# Patient Record
Sex: Female | Born: 1965 | Race: White | Hispanic: Yes | Smoking: Never smoker
Health system: Southern US, Community
[De-identification: ages and names within clinical notes are randomized; demographics above are authoritative.]

## PROBLEM LIST (undated history)

## (undated) DIAGNOSIS — E785 Hyperlipidemia, unspecified: Secondary | ICD-10-CM

## (undated) DIAGNOSIS — E119 Type 2 diabetes mellitus without complications: Secondary | ICD-10-CM

## (undated) DIAGNOSIS — I10 Essential (primary) hypertension: Secondary | ICD-10-CM

## (undated) HISTORY — DX: Hyperlipidemia, unspecified: E78.5

---

## 2010-06-29 ENCOUNTER — Inpatient Hospital Stay (HOSPITAL_COMMUNITY): Admission: AD | Admit: 2010-06-29 | Discharge: 2010-06-29 | Payer: Self-pay | Admitting: Obstetrics & Gynecology

## 2010-06-29 ENCOUNTER — Ambulatory Visit: Payer: Self-pay | Admitting: Obstetrics and Gynecology

## 2010-08-31 ENCOUNTER — Ambulatory Visit (HOSPITAL_COMMUNITY)
Admission: RE | Admit: 2010-08-31 | Discharge: 2010-08-31 | Payer: Self-pay | Source: Home / Self Care | Attending: Family Medicine | Admitting: Family Medicine

## 2010-11-30 LAB — WET PREP, GENITAL: Clue Cells Wet Prep HPF POC: NONE SEEN

## 2010-11-30 LAB — HCG, QUANTITATIVE, PREGNANCY: hCG, Beta Chain, Quant, S: 143 m[IU]/mL — ABNORMAL HIGH (ref <5)

## 2010-11-30 LAB — ABO/RH: ABO/RH(D): O POS

## 2010-11-30 LAB — CBC
HCT: 37.6 % (ref 36.0–46.0)
Hemoglobin: 13 g/dL (ref 12.0–15.0)
MCV: 89.5 fL (ref 78.0–100.0)
RBC: 4.2 MIL/uL (ref 3.87–5.11)
WBC: 5.7 10*3/uL (ref 4.0–10.5)

## 2010-11-30 LAB — POCT PREGNANCY, URINE: Preg Test, Ur: POSITIVE

## 2012-06-11 IMAGING — US US OB TRANSVAGINAL MODIFY
1 series · 14 of 28 positions shown · non-contrast
Comparison: none

OBSTETRICAL ULTRASOUND:
 This ultrasound exam was performed in the [HOSPITAL] Ultrasound Department.  The OB US report was generated in the AS system, and faxed to the ordering physician.  This report is also available in [HOSPITAL]?s AccessANYware and in [REDACTED] PACS.

[Series 1: us ob comp less 14 wks · 0.24mm/px · 14 of 59 slices shown]
[im 3/59]
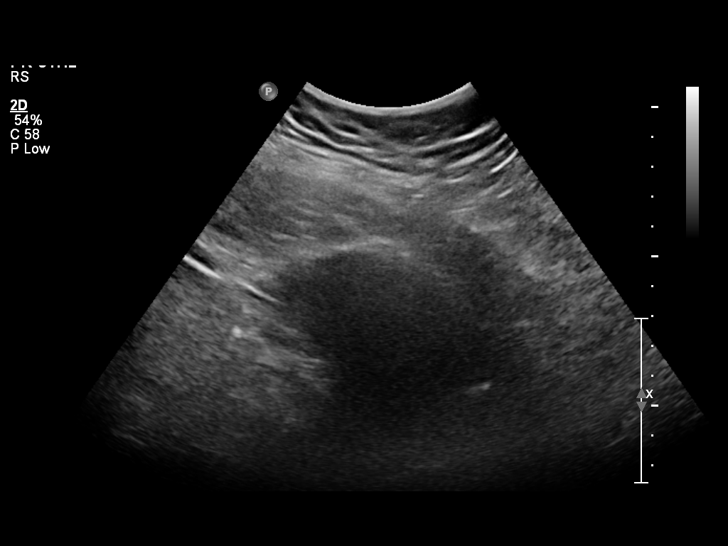
[im 7/59]
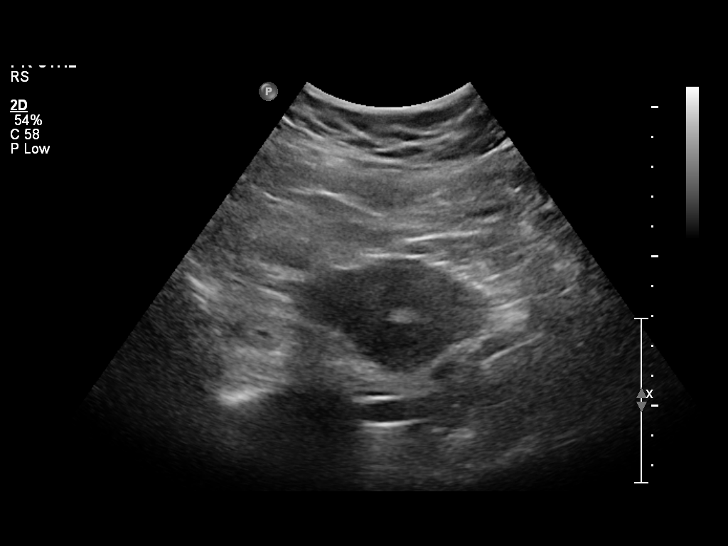
[im 11/59]
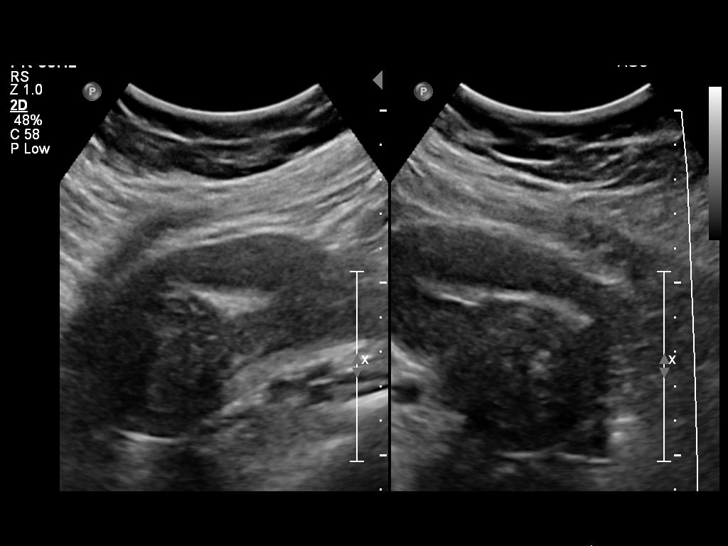
[im 16/59]
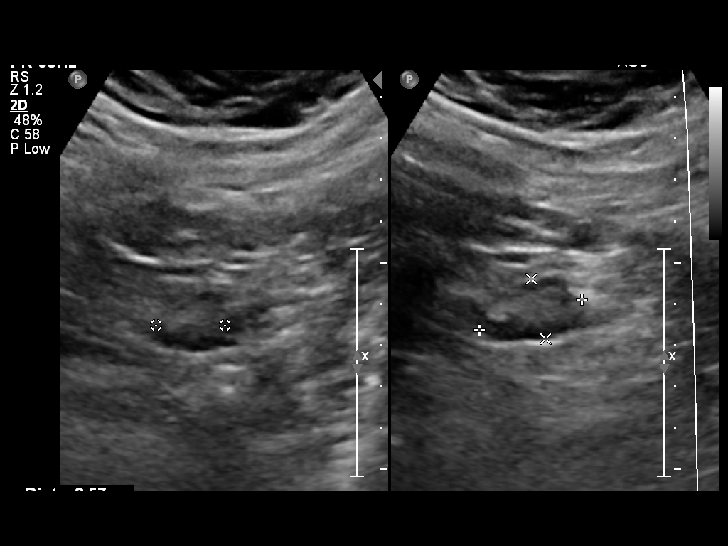
[im 20/59]
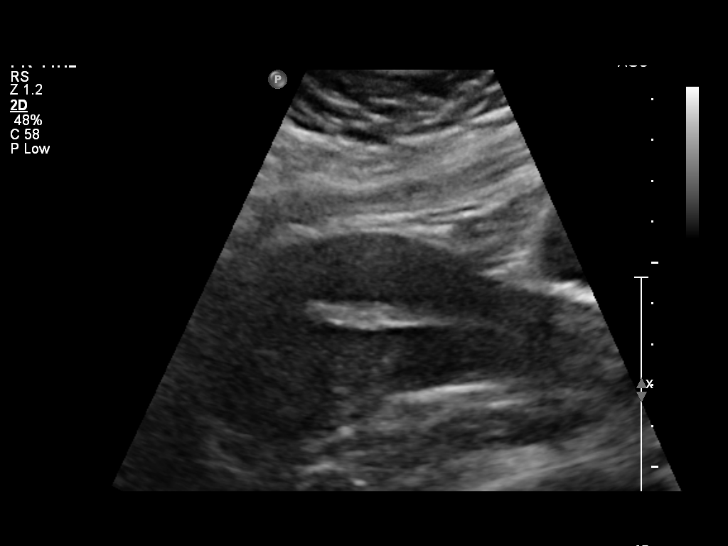
[im 24/59]
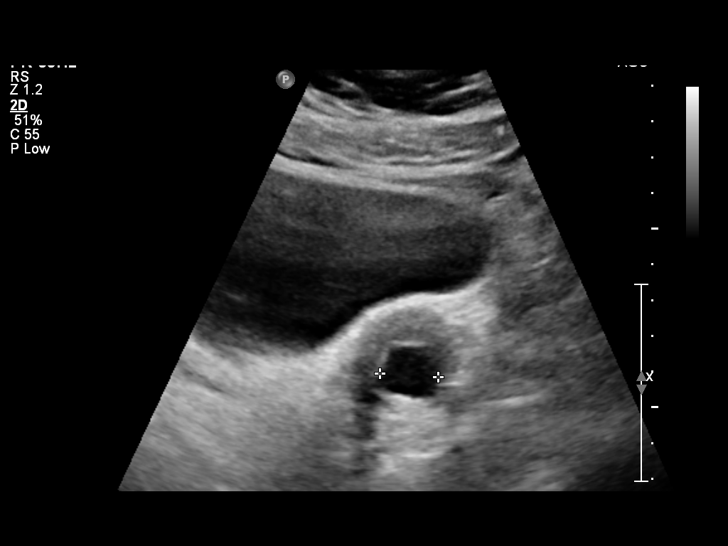
[im 28/59]
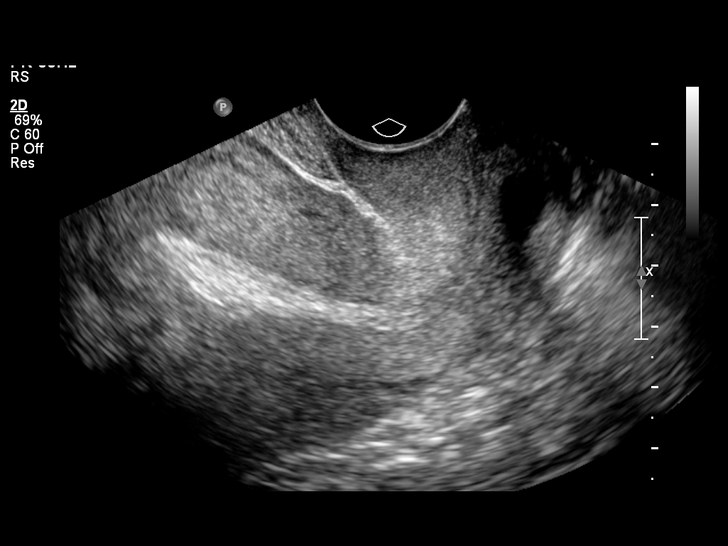
[im 33/59]
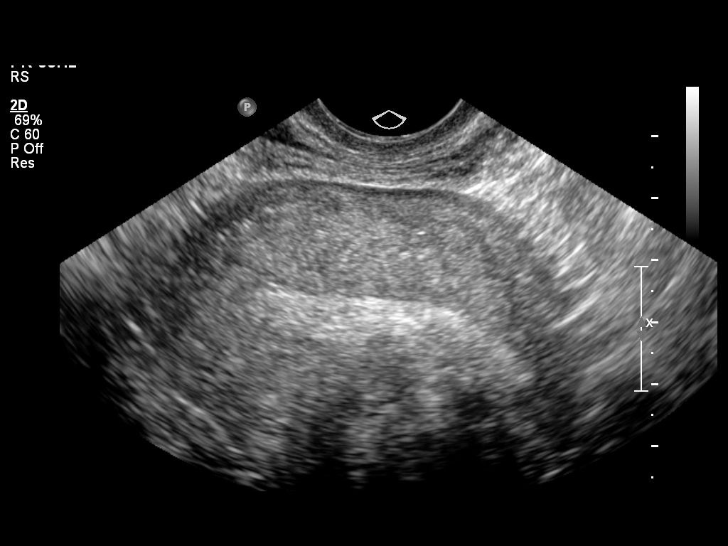
[im 37/59]
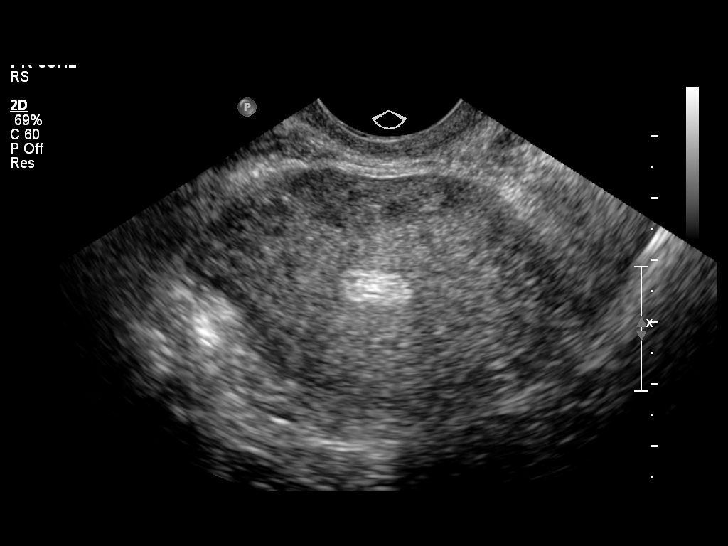
[im 41/59]
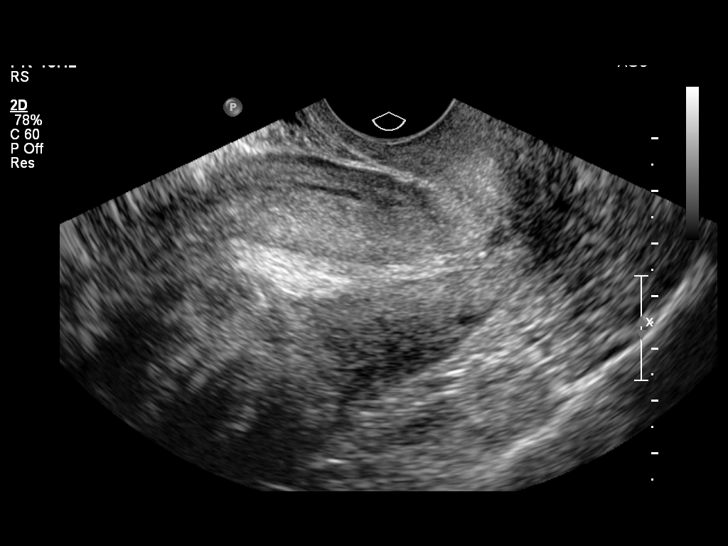
[im 46/59]
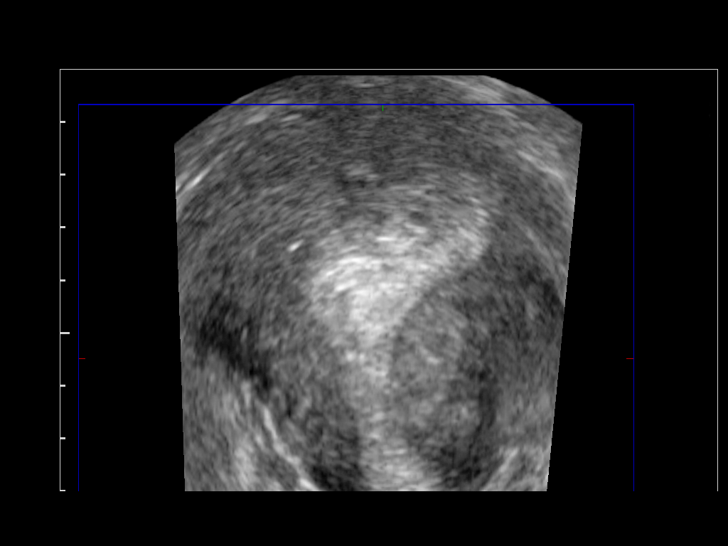
[im 50/59]
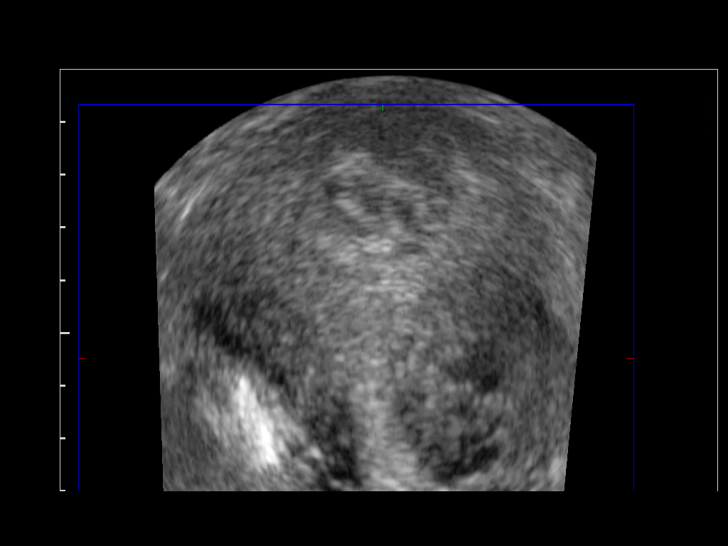
[im 54/59]
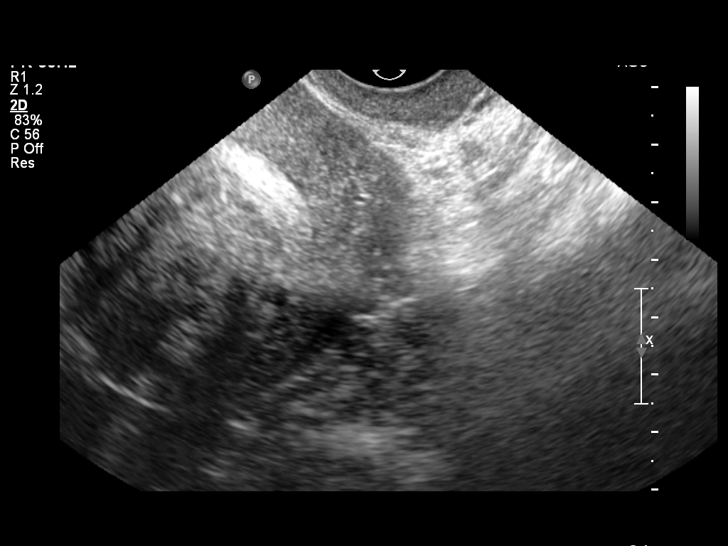
[im 59/59]
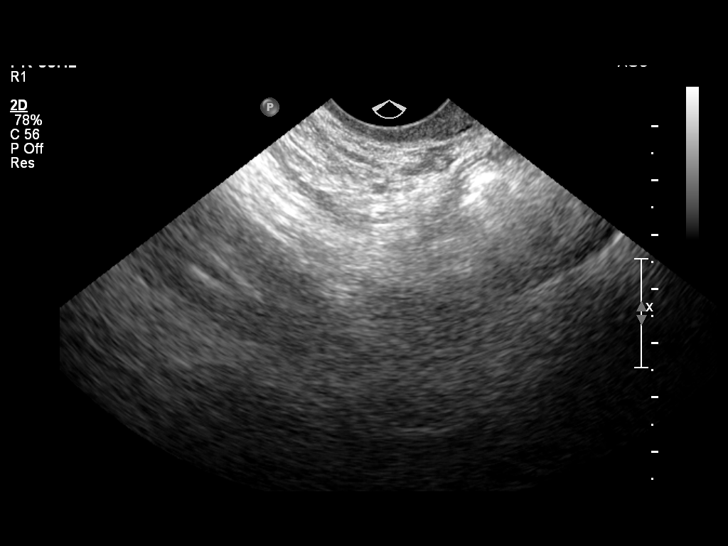

[14 of 28 positions shown; findings below may reference images not displayed]

IMPRESSION: See AS Obstetric US report.

## 2018-04-07 ENCOUNTER — Encounter: Payer: Self-pay | Admitting: Family Medicine

## 2018-04-07 ENCOUNTER — Other Ambulatory Visit: Payer: Self-pay

## 2018-04-07 ENCOUNTER — Ambulatory Visit: Payer: Self-pay | Admitting: Family Medicine

## 2018-04-07 VITALS — BP 177/85 | HR 103 | Temp 103.0°F | Ht <= 58 in | Wt 186.4 lb

## 2018-04-07 DIAGNOSIS — R109 Unspecified abdominal pain: Secondary | ICD-10-CM

## 2018-04-07 DIAGNOSIS — R509 Fever, unspecified: Secondary | ICD-10-CM

## 2018-04-07 DIAGNOSIS — R5383 Other fatigue: Secondary | ICD-10-CM

## 2018-04-07 DIAGNOSIS — R300 Dysuria: Secondary | ICD-10-CM

## 2018-04-07 LAB — POCT INFLUENZA A/B
Influenza A, POC: NEGATIVE
Influenza B, POC: NEGATIVE

## 2018-04-07 LAB — POCT CBC
Granulocyte percent: 70.9 %G (ref 37–80)
HEMATOCRIT: 38.6 % (ref 37.7–47.9)
HEMOGLOBIN: 12.5 g/dL (ref 12.2–16.2)
LYMPH, POC: 1.7 (ref 0.6–3.4)
MCH: 28 pg (ref 27–31.2)
MCHC: 32.4 g/dL (ref 31.8–35.4)
MCV: 86.2 fL (ref 80–97)
MID (cbc): 0.5 (ref 0–0.9)
MPV: 7.2 fL (ref 0–99.8)
POC Granulocyte: 5.5 (ref 2–6.9)
POC LYMPH PERCENT: 22.3 %L (ref 10–50)
POC MID %: 6.8 % (ref 0–12)
Platelet Count, POC: 179 10*3/uL (ref 142–424)
RBC: 4.48 M/uL (ref 4.04–5.48)
RDW, POC: 13.7 %
WBC: 7.8 10*3/uL (ref 4.6–10.2)

## 2018-04-07 LAB — POCT URINALYSIS DIP (MANUAL ENTRY)
BILIRUBIN UA: NEGATIVE mg/dL
Bilirubin, UA: NEGATIVE
Blood, UA: NEGATIVE
Glucose, UA: NEGATIVE mg/dL
Leukocytes, UA: NEGATIVE
Nitrite, UA: NEGATIVE
PH UA: 6 (ref 5.0–8.0)
SPEC GRAV UA: 1.025 (ref 1.010–1.025)
Urobilinogen, UA: 2 E.U./dL — AB

## 2018-04-07 LAB — POC MICROSCOPIC URINALYSIS (UMFC): Mucus: ABSENT

## 2018-04-07 LAB — GLUCOSE, POCT (MANUAL RESULT ENTRY): POC Glucose: 176 mg/dl — AB (ref 70–99)

## 2018-04-07 NOTE — Patient Instructions (Addendum)
Unfortunately I do not have a cause of your fever based on testing in the office.  With the amount of fatigue, fever, and other current symptoms, you will need further evaluation to the emergency room.  Please proceed to the emergency room as soon as leaving our office.  They will be able to see the testing that I have performed here in our office.    IF you received an x-ray today, you will receive an invoice from [facility]. Please contact [facility] at [phone] with questions or concerns regarding your invoice.   IF you received labwork today, you will receive an invoice from [facility]. Please contact [facility] at [phone] with questions or concerns regarding your invoice.   Our billing staff will not be able to assist you with questions regarding bills from these companies.  You will be contacted with the lab results as soon as they are available. The fastest way to get your results is to activate your My Chart account. Instructions are located on the last page of this paperwork. If you have not heard from us regarding the results in 2 weeks, please contact this office.

## 2018-04-07 NOTE — Progress Notes (Signed)
Subjective:    Patient ID: Allison Daniels, female    DOB: [birth year], 52 y.o.   MRN: [id number]  HPI Maite Burlison is a 52 y.o. female Presents today for: Chief Complaint  Patient presents with  . Fever  . light headed  . Fatigue  . Dehydration   Presents with above symptoms, past few days.  3 days ago started with fever, did report dysuria for approximately 1 day, none currently.  Denies cough, does have slight abdominal discomfort with nausea but no vomiting.  Has not been wanting to eat or drink much this past week, husband passed away 1 week ago.  Also is complaining of headache, slight neck discomfort without stiffness no photophobia, no phonophobia.   Has had some weakness in the recliner phone numbers to help her around.  Minimal p.o. intake.  Urine output x3 today.  Denies recent tick bites or known tick exposure.  Denies rash.    There are no active problems to display for this patient.  History reviewed. No pertinent past medical history. History reviewed. No pertinent surgical history. No Known Allergies Prior to Admission medications   Medication Sig Start Date End Date Taking? Authorizing Provider  lisinopril (PRINIVIL,ZESTRIL) 10 MG tablet Take 10 mg by mouth daily.   Yes [provider]  metFORMIN (GLUCOPHAGE) 500 MG tablet Take 500 mg by mouth 2 (two) times daily with a meal.   Yes [provider]  naproxen (NAPROSYN) 500 MG tablet Take 500 mg by mouth 2 (two) times daily with a meal.   Yes [provider]  simvastatin (ZOCOR) 20 MG tablet Take 20 mg by mouth daily.   Yes [provider]   Social History   Socioeconomic History  . Marital status: Married    Spouse name: Not on file  . Number of children: Not on file  . Years of education: Not on file  . Highest education level: Not on file  Occupational History  . Not on file  Social Needs  . Financial resource strain: Not on file  . Food insecurity:   Worry: Not on file    Inability: Not on file  . Transportation needs:    Medical: Not on file    Non-medical: Not on file  Tobacco Use  . Smoking status: Never Smoker  . Smokeless tobacco: Never Used  Substance and Sexual Activity  . Alcohol use: Never    Frequency: Never  . Drug use: Not on file  . Sexual activity: Not on file  Lifestyle  . Physical activity:    Days per week: Not on file    Minutes per session: Not on file  . Stress: Not on file  Relationships  . Social connections:    Talks on phone: Not on file    Gets together: Not on file    Attends religious service: Not on file    Active member of club or organization: Not on file    Attends meetings of clubs or organizations: Not on file    Relationship status: Not on file  . Intimate partner violence:    Fear of current or ex partner: Not on file    Emotionally abused: Not on file    Physically abused: Not on file    Forced sexual activity: Not on file  Other Topics Concern  . Not on file  Social History Narrative  . Not on file    Review of Systems As above.     Objective:  Physical Exam  Constitutional: She appears well-developed and well-nourished. She appears lethargic. No distress.  HENT:  Head: Normocephalic and atraumatic.  Right Ear: Hearing, tympanic membrane, external ear and ear canal normal.  Left Ear: Hearing, tympanic membrane, external ear and ear canal normal.  Nose: Nose normal.  Mouth/Throat: Oropharynx is clear and moist. No oropharyngeal exudate.  Moist tongue.   Eyes: Pupils are equal, round, and reactive to light. Conjunctivae and EOM are normal.  Cardiovascular: Normal rate, regular rhythm, normal heart sounds and intact distal pulses.  No murmur heard. Pulmonary/Chest: Effort normal and breath sounds normal. No respiratory distress. She has no wheezes. She has no rhonchi.  Abdominal: Soft. There is tenderness (LUQ, LLQ).  Neurological: She has normal strength. She appears  lethargic.  Skin: Skin is warm and dry. Capillary refill takes less than 2 seconds. No rash noted.  Psychiatric: She has a normal mood and affect. Her behavior is normal.  Vitals reviewed.  Vitals:   04/07/18 1401  BP: (!) 177/85  Pulse: (!) 103  Temp: (!) 103 F (39.4 C)  TempSrc: Oral  SpO2: 99%  Weight: 186 lb 6.4 oz (84.6 kg)  Height: 4\' 10"  (1.473 m)     Results for orders placed or performed in visit on 04/07/18  POCT glucose (manual entry)  Result Value Ref Range   POC Glucose 176 (A) 70 - 99 mg/dl  POCT CBC  Result Value Ref Range   WBC 7.8 4.6 - 10.2 K/uL   Lymph, poc 1.7 0.6 - 3.4   POC LYMPH PERCENT 22.3 10 - 50 %L   MID (cbc) 0.5 0 - 0.9   POC MID % 6.8 0 - 12 %M   POC Granulocyte 5.5 2 - 6.9   Granulocyte percent 70.9 37 - 80 %G   RBC 4.48 4.04 - 5.48 M/uL   Hemoglobin 12.5 12.2 - 16.2 g/dL   HCT, POC 40.938.6 81.137.7 - 47.9 %   MCV 86.2 80 - 97 fL   MCH, POC 28.0 27 - 31.2 pg   MCHC 32.4 31.8 - 35.4 g/dL   RDW, POC 91.413.7 %   Platelet Count, POC 179 142 - 424 K/uL   MPV 7.2 0 - 99.8 fL   Results for orders placed or performed in visit on 04/07/18  POCT urinalysis dipstick  Result Value Ref Range   Color, UA yellow yellow   Clarity, UA clear clear   Glucose, UA negative negative mg/dL   Bilirubin, UA negative negative   Ketones, POC UA negative negative mg/dL   Spec Grav, UA 7.8291.025 5.6211.010 - 1.025   Blood, UA negative negative   pH, UA 6.0 5.0 - 8.0   Protein Ur, POC =30 (A) negative mg/dL   Urobilinogen, UA 2.0 (A) 0.2 or 1.0 E.U./dL   Nitrite, UA Negative Negative   Leukocytes, UA Negative Negative  POCT Microscopic Urinalysis ([facility])  Result Value Ref Range   WBC,UR,HPF,POC Few (A) None WBC/hpf   RBC,UR,HPF,POC None None RBC/hpf   Bacteria None None, Too numerous to count   Mucus Absent Absent   Epithelial Cells, UR Per Microscopy Many (A) None, Too numerous to count cells/hpf  POCT glucose (manual entry)  Result Value Ref Range   POC Glucose 176  (A) 70 - 99 mg/dl  POCT CBC  Result Value Ref Range   WBC 7.8 4.6 - 10.2 K/uL   Lymph, poc 1.7 0.6 - 3.4   POC LYMPH PERCENT 22.3 10 - 50 %L   MID (  cbc) 0.5 0 - 0.9   POC MID % 6.8 0 - 12 %M   POC Granulocyte 5.5 2 - 6.9   Granulocyte percent 70.9 37 - 80 %G   RBC 4.48 4.04 - 5.48 M/uL   Hemoglobin 12.5 12.2 - 16.2 g/dL   HCT, POC 16.1 09.6 - 47.9 %   MCV 86.2 80 - 97 fL   MCH, POC 28.0 27 - 31.2 pg   MCHC 32.4 31.8 - 35.4 g/dL   RDW, POC 04.5 %   Platelet Count, POC 179 142 - 424 K/uL   MPV 7.2 0 - 99.8 fL  POCT Influenza A/B  Result Value Ref Range   Influenza A, POC Negative Negative   Influenza B, POC Negative Negative       Assessment & Plan:  Dajai Wahlert is a 52 y.o. female Dysuria - Plan: POCT urinalysis dipstick, POCT Microscopic Urinalysis ([facility])  Fever, unspecified - Plan: POCT CBC, POCT Influenza A/B  Other fatigue - Plan: POCT glucose (manual entry)  Abdominal pain, unspecified abdominal location  Fever of unknown cause, tachycardia, abdominal pain, and fatigue with decreased p.o. intake reported.  Possible SIRS.  No known tick exposure, no rash.  Additionally she does have history of diabetes, hypertension, including use of lisinopril with decreased p.o. intake, acute renal insufficiency/failure possible with reported volume depletion.  Blood pressure is elevated in office, but did not take medication today.  -Based on current symptoms and exam, recommended further evaluation through emergency room.   Present with daughter who will drive her by private vehicle.  No orders of the defined types were placed in this encounter.  Patient Instructions   Unfortunately I do not have a cause of your fever based on testing in the office.  With the amount of fatigue, fever, and other current symptoms, you will need further evaluation to the emergency room.  Please proceed to the emergency room as soon as leaving our office.  They will be able to see the testing  that I have performed here in our office.    IF you received an x-ray today, you will receive an invoice from [facility]. Please contact [facility] at [phone] with questions or concerns regarding your invoice.   IF you received labwork today, you will receive an invoice from [place]. Please contact [facility] at [phone] with questions or concerns regarding your invoice.   Our billing staff will not be able to assist you with questions regarding bills from these companies.  You will be contacted with the lab results as soon as they are available. The fastest way to get your results is to activate your My Chart account. Instructions are located on the last page of this paperwork. If you have not heard from [place] regarding the results in 2 weeks, please contact this office.       I personally performed the services described in this documentation, which was scribed in my presence. The recorded information has been reviewed and considered for accuracy and completeness, addended by me as needed, and agree with information above.  Signed,   Meredith Staggers, MD Primary Care at [facility].  04/07/18 2:59 PM

## 2018-04-08 ENCOUNTER — Ambulatory Visit: Payer: Self-pay | Admitting: Urgent Care

## 2019-04-01 ENCOUNTER — Emergency Department (HOSPITAL_COMMUNITY): Payer: Self-pay

## 2019-04-01 ENCOUNTER — Emergency Department (HOSPITAL_COMMUNITY)
Admission: EM | Admit: 2019-04-01 | Discharge: 2019-04-01 | Disposition: A | Discharge status: Home / Self Care | Payer: Self-pay | Attending: Emergency Medicine | Admitting: Emergency Medicine

## 2019-04-01 ENCOUNTER — Other Ambulatory Visit: Payer: Self-pay

## 2019-04-01 ENCOUNTER — Encounter (HOSPITAL_COMMUNITY): Payer: Self-pay

## 2019-04-01 DIAGNOSIS — Z7984 Long term (current) use of oral hypoglycemic drugs: Secondary | ICD-10-CM | POA: Insufficient documentation

## 2019-04-01 DIAGNOSIS — Z79899 Other long term (current) drug therapy: Secondary | ICD-10-CM | POA: Insufficient documentation

## 2019-04-01 DIAGNOSIS — I10 Essential (primary) hypertension: Secondary | ICD-10-CM | POA: Insufficient documentation

## 2019-04-01 DIAGNOSIS — R0602 Shortness of breath: Secondary | ICD-10-CM | POA: Insufficient documentation

## 2019-04-01 DIAGNOSIS — E119 Type 2 diabetes mellitus without complications: Secondary | ICD-10-CM | POA: Insufficient documentation

## 2019-04-01 DIAGNOSIS — R091 Pleurisy: Secondary | ICD-10-CM | POA: Insufficient documentation

## 2019-04-01 HISTORY — DX: Essential (primary) hypertension: I10

## 2019-04-01 HISTORY — DX: Type 2 diabetes mellitus without complications: E11.9

## 2019-04-01 LAB — CBC
HCT: 40.3 % (ref 36.0–46.0)
Hemoglobin: 13 g/dL (ref 12.0–15.0)
MCH: 28.9 pg (ref 26.0–34.0)
MCHC: 32.3 g/dL (ref 30.0–36.0)
MCV: 89.6 fL (ref 80.0–100.0)
Platelets: 207 10*3/uL (ref 150–400)
RBC: 4.5 MIL/uL (ref 3.87–5.11)
RDW: 13.6 % (ref 11.5–15.5)
WBC: 6.2 10*3/uL (ref 4.0–10.5)
nRBC: 0 % (ref 0.0–0.2)

## 2019-04-01 LAB — TROPONIN I (HIGH SENSITIVITY): Troponin I (High Sensitivity): 8 ng/L (ref <18)

## 2019-04-01 LAB — BASIC METABOLIC PANEL
Anion gap: 7 (ref 5–15)
BUN: 9 mg/dL (ref 6–20)
CO2: 23 mmol/L (ref 22–32)
Calcium: 8.2 mg/dL — ABNORMAL LOW (ref 8.9–10.3)
Chloride: 107 mmol/L (ref 98–111)
Creatinine, Ser: 0.47 mg/dL (ref 0.44–1.00)
GFR calc Af Amer: >60 mL/min (ref >60)
GFR calc non Af Amer: >60 mL/min (ref >60)
Glucose, Bld: 231 mg/dL — ABNORMAL HIGH (ref 70–99)
Potassium: 3.6 mmol/L (ref 3.5–5.1)
Sodium: 137 mmol/L (ref 135–145)

## 2019-04-01 LAB — I-STAT BETA HCG BLOOD, ED (MC, WL, AP ONLY): I-stat hCG, quantitative: <5 m[IU]/mL (ref <5)

## 2019-04-01 LAB — D-DIMER, QUANTITATIVE: D-Dimer, Quant: <0.27 ug/mL-FEU (ref 0.00–0.50)

## 2019-04-01 MED ORDER — DIAZEPAM 2 MG PO TABS: 2.0000 mg | ORAL_TABLET | Freq: Four times a day (QID) | ORAL | 0 refills | Status: DC | PRN

## 2019-04-01 MED ORDER — SODIUM CHLORIDE 0.9% FLUSH: 3.0000 mL | Freq: Once | INTRAVENOUS | Status: DC

## 2019-04-01 NOTE — ED Notes (Addendum)
Discharge instructions reviewed with pt using [place]- JP # [phone].    Pt verbalized understanding, had no questions.  Pt stated she was comfortable to walk to ED exit.  NAD.

## 2019-04-01 NOTE — ED Provider Notes (Signed)
[facility]-EMERGENCY DEPT Provider Note   CSN: [id number] Arrival date & time: 04/01/19  1134     History   Chief Complaint Chief Complaint  Patient presents with  . Chest Pain  . Shortness of Breath    HPI Allison Daniels is a 53 y.o. female.     53 year old female with history of diabetes and hypertension presents with intermittent sharp chest pain that is worse with inspiration.  Denies any fever or cough or congestion.  Some exertional component to her symptoms.  No prior history of same.  States that symptoms became worse after she had an argument at work.  Denies any leg pain or swelling.  No prior history of coronary disease.  Pain lasts for seconds to hours.  Does not radiate to her arms or to her back.  No treatment use prior to arrival nothing makes it better.  Interpreter used during this encounter     Past Medical History:  Diagnosis Date  . Diabetes mellitus without complication (HCC)   . Hypertension     There are no active problems to display for this patient.   History reviewed. No pertinent surgical history.   OB History   No obstetric history on file.      Home Medications    Prior to Admission medications   Medication Sig Start Date End Date Taking? Authorizing Provider  lisinopril (PRINIVIL,ZESTRIL) 10 MG tablet Take 10 mg by mouth daily.    [provider]  metFORMIN (GLUCOPHAGE) 500 MG tablet Take 500 mg by mouth 2 (two) times daily with a meal.    [provider]  naproxen (NAPROSYN) 500 MG tablet Take 500 mg by mouth 2 (two) times daily with a meal.    [provider]  simvastatin (ZOCOR) 20 MG tablet Take 20 mg by mouth daily.    [provider]    Family History No family history on file.  Social History Social History   Tobacco Use  . Smoking status: Never Smoker  . Smokeless tobacco: Never Used  Substance Use Topics  . Alcohol use: Not Currently    Frequency:  Never    Comment: ocassional  . Drug use: Not on file     Allergies   Patient has no known allergies.   Review of Systems Review of Systems  All other systems reviewed and are negative.    Physical Exam Updated Vital Signs BP (!) 194/97 (BP Location: Left Arm)   Pulse 86   Temp 99.4 F (37.4 C) (Oral)   Resp 14   Wt 83.8 kg   SpO2 98%   BMI 38.62 kg/m   Physical Exam Vitals signs and nursing note reviewed.  Constitutional:      General: She is not in acute distress.    Appearance: Normal appearance. She is well-developed. She is not toxic-appearing.  HENT:     Head: Normocephalic and atraumatic.  Eyes:     General: Lids are normal.     Conjunctiva/sclera: Conjunctivae normal.     Pupils: Pupils are equal, round, and reactive to light.  Neck:     Musculoskeletal: Normal range of motion and neck supple.     Thyroid: No thyroid mass.     Trachea: No tracheal deviation.  Cardiovascular:     Rate and Rhythm: Normal rate and regular rhythm.     Heart sounds: Normal heart sounds. No murmur. No gallop.   Pulmonary:     Effort: Pulmonary effort is normal.  No respiratory distress.     Breath sounds: Normal breath sounds. No stridor. No decreased breath sounds, wheezing, rhonchi or rales.  Abdominal:     General: Bowel sounds are normal. There is no distension.     Palpations: Abdomen is soft.     Tenderness: There is no abdominal tenderness. There is no rebound.  Musculoskeletal: Normal range of motion.        General: No tenderness.  Skin:    General: Skin is warm and dry.     Findings: No abrasion or rash.  Neurological:     Mental Status: She is alert and oriented to person, place, and time.     GCS: GCS eye subscore is 4. GCS verbal subscore is 5. GCS motor subscore is 6.     Cranial Nerves: No cranial nerve deficit.     Sensory: No sensory deficit.  Psychiatric:        Speech: Speech normal.        Behavior: Behavior normal.      ED Treatments /  Results  Labs (all labs ordered are listed, but only abnormal results are displayed) Labs Reviewed  BASIC METABOLIC PANEL  CBC  D-DIMER, QUANTITATIVE (NOT AT [facility])  I-STAT BETA HCG BLOOD, ED ([facility], [facility], [facility] ONLY)  TROPONIN I (HIGH SENSITIVITY)    EKG EKG Interpretation  Date/Time:  Wednesday April 01 2019 11:46:01 EDT Ventricular Rate:  82 PR Interval:  152 QRS Duration: 76 QT Interval:  372 QTC Calculation: 434 R Axis:   18 Text Interpretation:  Normal sinus rhythm T wave abnormality, consider lateral ischemia Abnormal ECG No old tracing to compare Confirmed by Lacretia Leigh ([id number]) on 04/01/2019 1:09:26 PM   Radiology No results found.  Procedures Procedures (including critical care time)  Medications Ordered in ED Medications - No data to display   Initial Impression / Assessment and Plan / ED Course  I have reviewed the triage vital signs and the nursing notes.  Pertinent labs & imaging results that were available during my care of the patient were reviewed by me and considered in my medical decision making (see chart for details).        Cardiac enzymes and d-dimer were negative.  X-ray is reassuring.  Suspect that she has some component of pleurisy and anxiety.  Blood pressure elevation was noted and patient encouraged to follow-up with her doctor for this.  Will place on muscle relaxants and return precautions given.  Final Clinical Impressions(s) / ED Diagnoses   Final diagnoses:  None    ED Discharge Orders    None       Lacretia Leigh, MD 04/01/19 1535

## 2019-04-01 NOTE — ED Triage Notes (Signed)
2 days ago, pt had sharp stabbing chest pain, with difficulty breathing and arm numbness. Pt was seen at urgent care, and told to come here.

## 2019-04-01 NOTE — ED Notes (Signed)
Patient transported to X-ray 

## 2019-07-04 ENCOUNTER — Other Ambulatory Visit: Payer: Self-pay

## 2019-07-04 DIAGNOSIS — Z20822 Contact with and (suspected) exposure to covid-19: Secondary | ICD-10-CM

## 2019-07-05 LAB — NOVEL CORONAVIRUS, NAA: SARS-CoV-2, NAA: DETECTED — AB

## 2019-07-08 ENCOUNTER — Telehealth: Payer: Self-pay | Admitting: Internal Medicine

## 2019-07-08 NOTE — Telephone Encounter (Signed)
Patient called for follow up positive COVID-19 test, doing well. Fever has subsided. Self isolating. All questions answered, her concerns addressed.  Angelica Chessman, MD  07/08/19  9:52 AM

## 2021-03-14 IMAGING — CR CHEST - 2 VIEW
2 series · 2 of 2 positions shown · non-contrast
Comparison: None.

CLINICAL DATA: Chest pain and shortness of breath for 2 days

EXAM:
CHEST - 2 VIEW

[w chest pa]
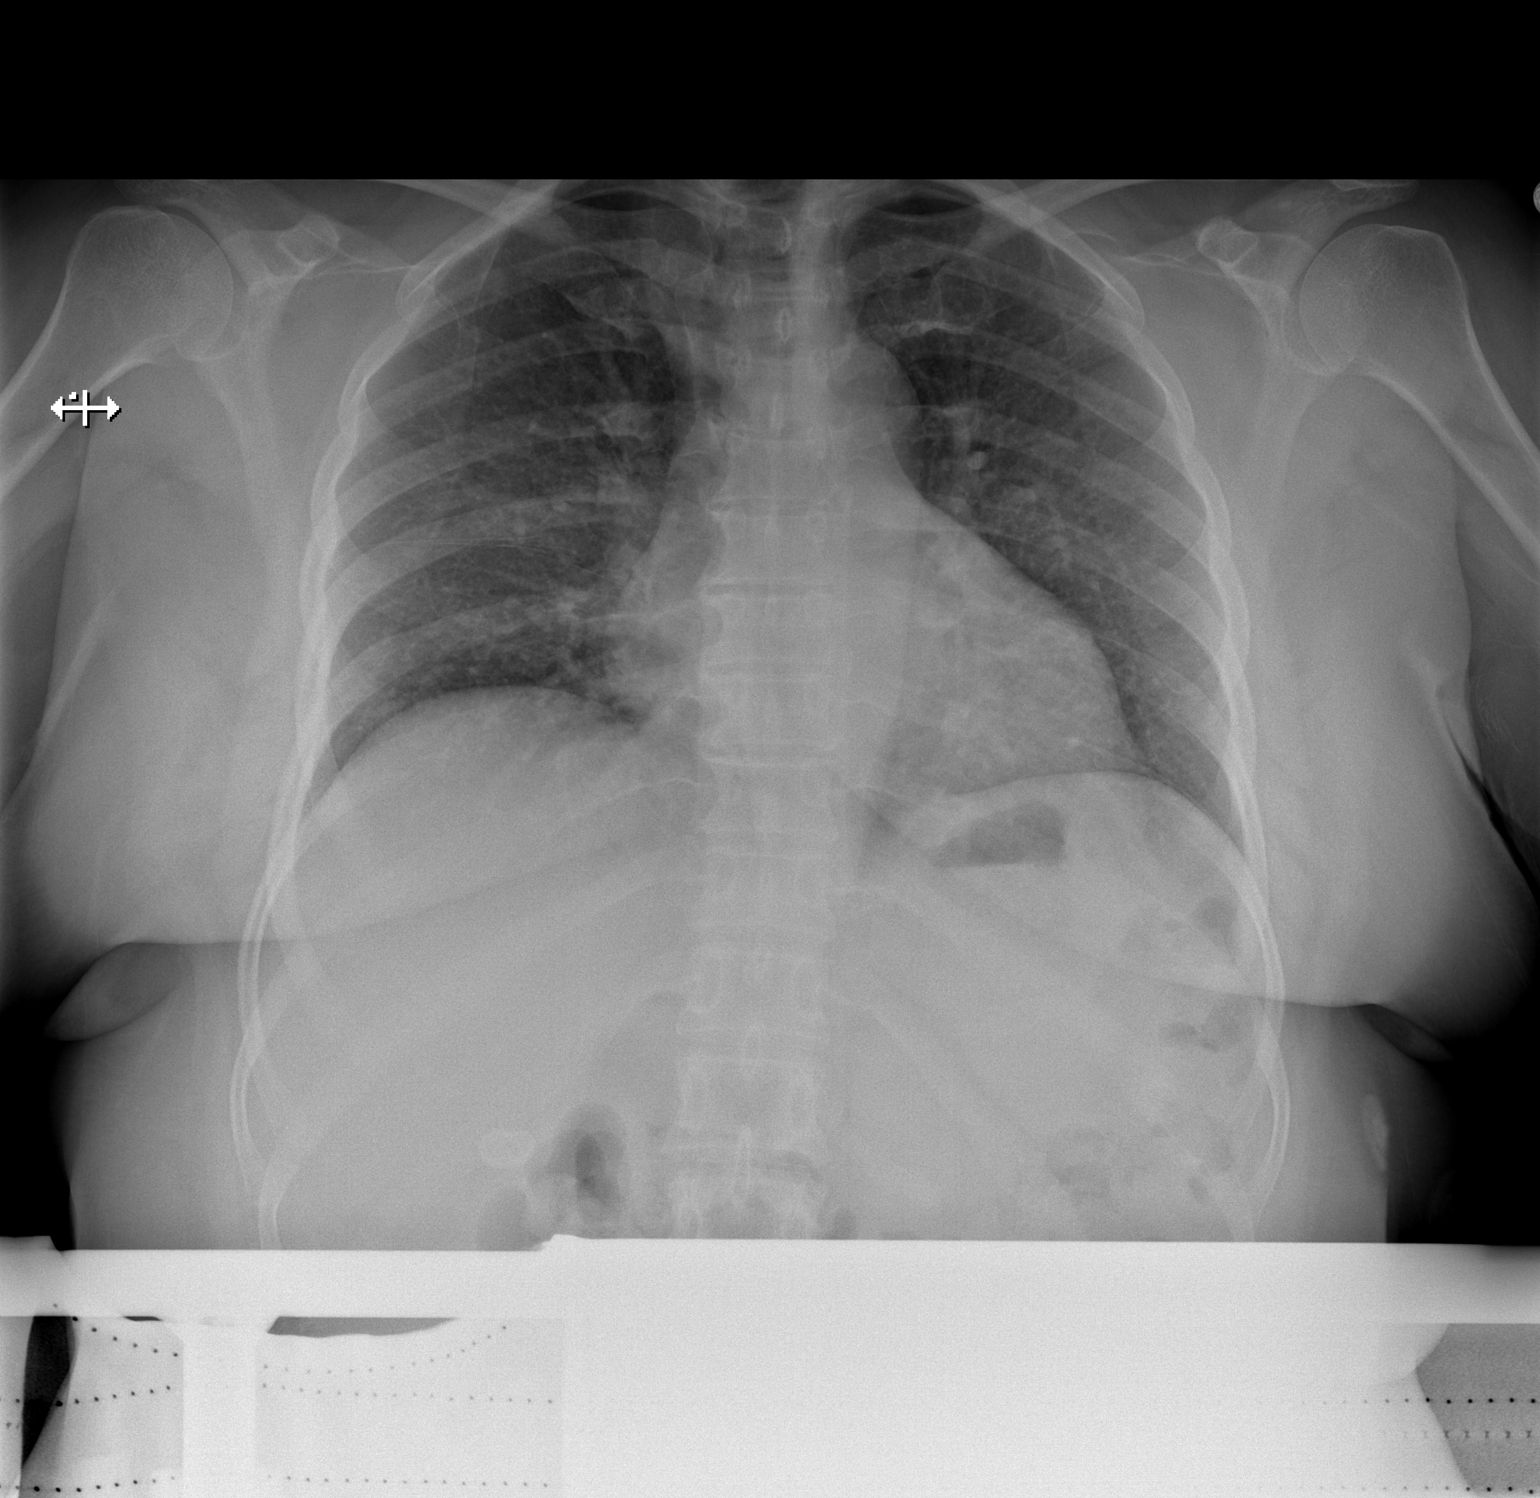

[w chest lat]
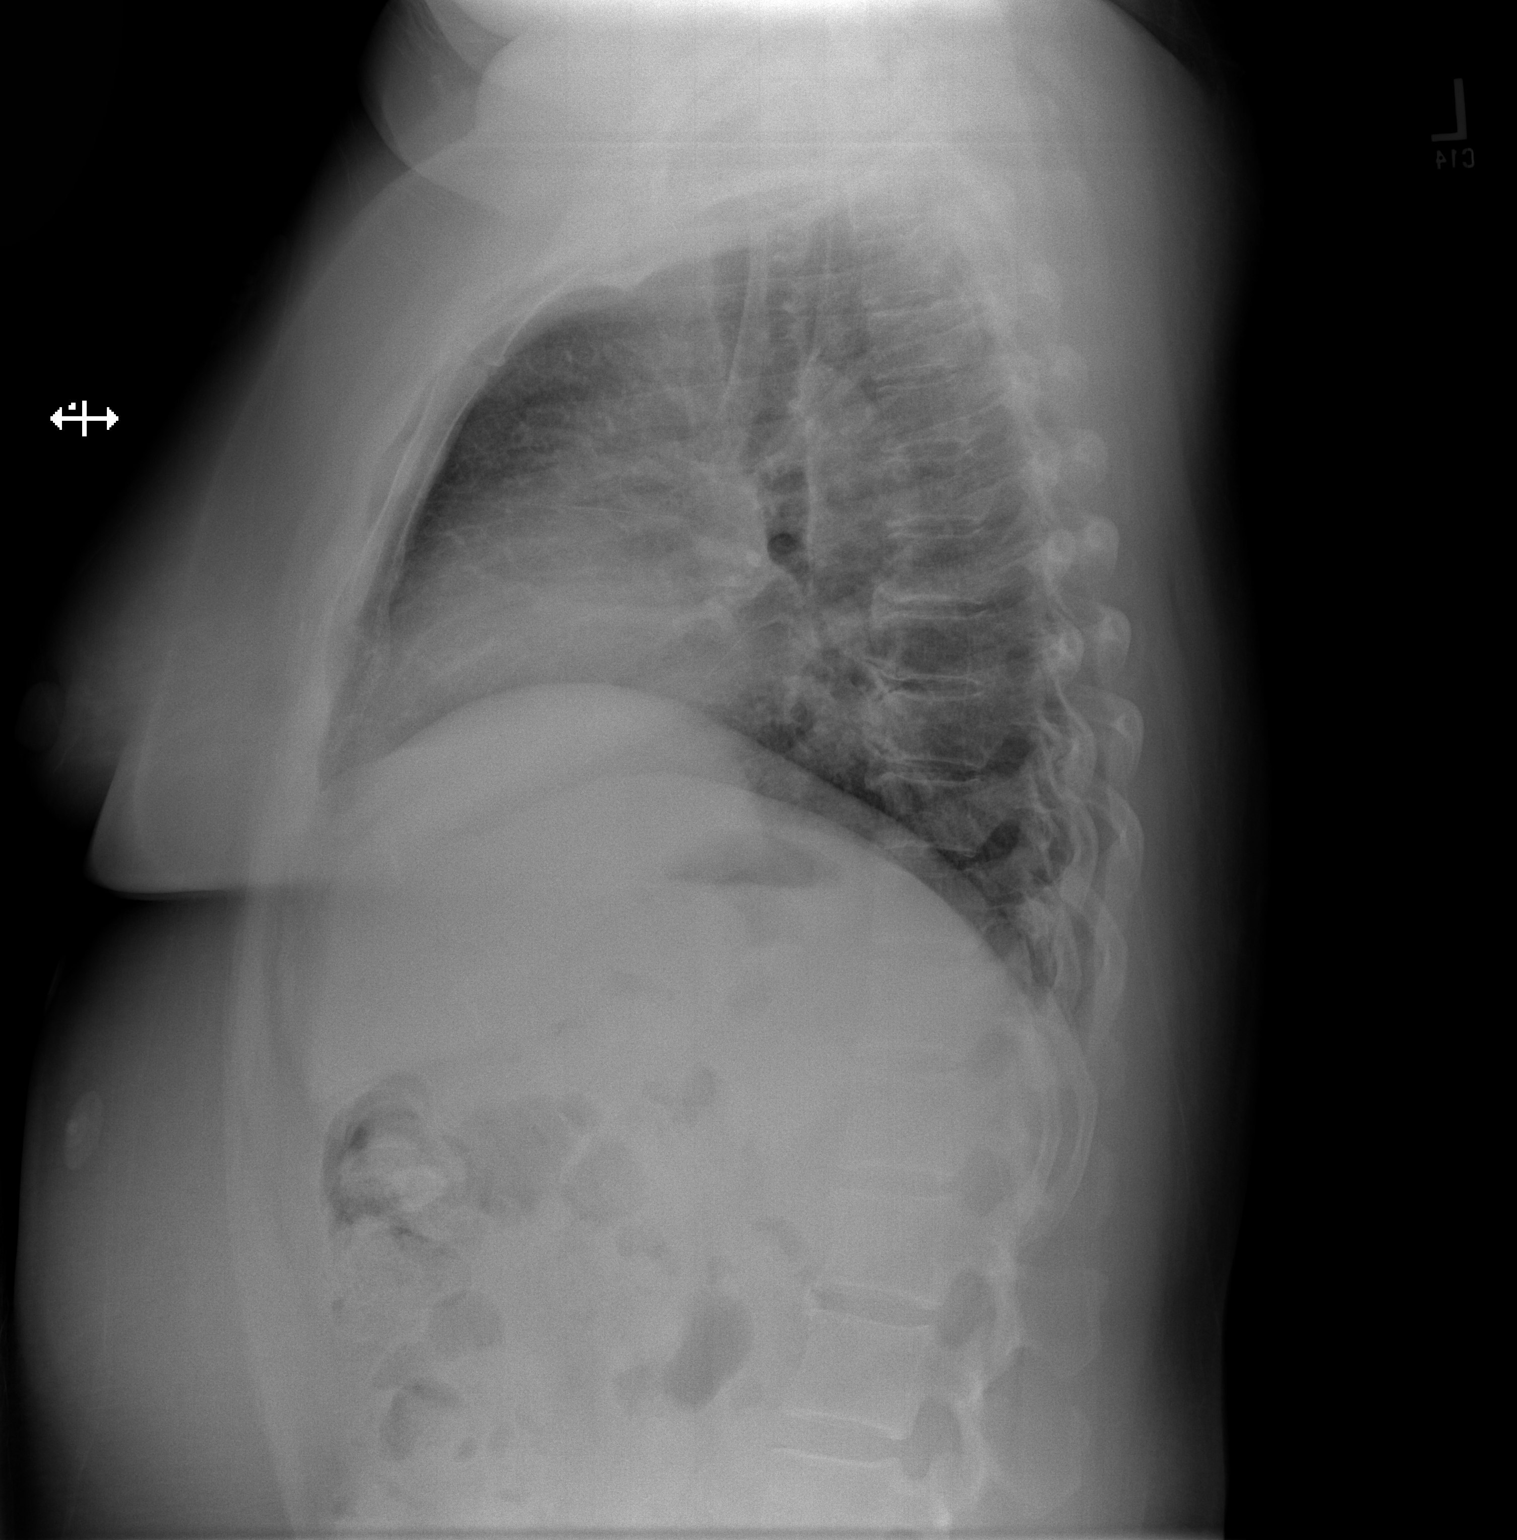

[2 of 2 positions shown; findings below may reference images not displayed]

FINDINGS: The heart size and mediastinal contours are within normal limits.
Both lungs are clear. The visualized skeletal structures are
unremarkable.
IMPRESSION: No active cardiopulmonary disease.

## 2022-12-31 ENCOUNTER — Other Ambulatory Visit: Payer: Self-pay

## 2022-12-31 DIAGNOSIS — Z1231 Encounter for screening mammogram for malignant neoplasm of breast: Secondary | ICD-10-CM

## 2023-01-31 ENCOUNTER — Ambulatory Visit: Payer: Self-pay | Admitting: Hematology and Oncology

## 2023-01-31 ENCOUNTER — Ambulatory Visit
Admission: RE | Admit: 2023-01-31 | Discharge: 2023-01-31 | Disposition: A | Discharge status: Home / Self Care | Payer: No Typology Code available for payment source | Source: Ambulatory Visit | Attending: Obstetrics and Gynecology | Admitting: Obstetrics and Gynecology

## 2023-01-31 ENCOUNTER — Other Ambulatory Visit: Payer: Self-pay

## 2023-01-31 VITALS — BP 134/57 | Wt 186.8 lb

## 2023-01-31 DIAGNOSIS — Z1231 Encounter for screening mammogram for malignant neoplasm of breast: Secondary | ICD-10-CM

## 2023-01-31 DIAGNOSIS — Z124 Encounter for screening for malignant neoplasm of cervix: Secondary | ICD-10-CM

## 2023-01-31 DIAGNOSIS — Z1211 Encounter for screening for malignant neoplasm of colon: Secondary | ICD-10-CM

## 2023-01-31 DIAGNOSIS — Z01419 Encounter for gynecological examination (general) (routine) without abnormal findings: Secondary | ICD-10-CM

## 2023-01-31 NOTE — Patient Instructions (Signed)
Taught Allison Daniels about self breast exam and gave educational materials to take home. Patient did not need a Pap smear today due to last Pap smear was in 07/20/2021 per patient. Let her know [facility] will cover Pap smears every 5 years unless has a history of abnormal Pap smears. Referred patient to the [facility] for screening mammogram. Appointment scheduled for 01/31/23. Patient aware of appointment and will be there. Let patient know will follow up with her within the next couple weeks with results. Allison Daniels verbalized understanding.  Pascal Lux, NP 2:07 PM

## 2023-01-31 NOTE — Progress Notes (Signed)
[name] is a 57 y.o. female who presents to [facility] clinic today with no complaints.    Pap Smear: Pap not smear completed today. Last Pap smear was 07/20/2021 and was normal. No HPV performed. Per patient has no history of an abnormal Pap smear. Last Pap smear result is not available in Epic.   Physical exam: Breasts Breasts symmetrical. No skin abnormalities bilateral breasts. No nipple retraction bilateral breasts. No nipple discharge bilateral breasts. No lymphadenopathy. No lumps palpated bilateral breasts.        Pelvic/Bimanual Pap is not indicated today    Smoking History: Patient has never smoked and was not referred to quit line.    Patient Navigation: Patient education provided. Access to services provided for patient through [facility] program. Natale Lay interpreter provided. No transportation provided   Colorectal Cancer Screening: Per patient has never had colonoscopy completed No complaints today. FIT test given.    Breast and Cervical Cancer Risk Assessment: Patient does not have family history of breast cancer, known genetic mutations, or radiation treatment to the chest before age 80. Patient does not have history of cervical dysplasia, immunocompromised, or DES exposure in-utero.  Risk Scores as of 01/31/2023     [name]           5-year 1.1 %   Lifetime 7.23 %   This patient is [place]/Lat[place] but has no documented birth country, so the [place] model used data from [place] patients to calculate their risk score. Document a birth country in the Demographics activity for a more accurate score.         Last calculated by Caprice Red, CMA on 01/31/2023 at  2:29 PM        A: [facility] exam without pap smear No complaints with benign exam.   P: Referred patient to the [facility] for a screening mammogram. Appointment scheduled 01/31/23.  Pascal Lux, NP 01/31/2023 2:37 PM

## 2023-02-05 ENCOUNTER — Other Ambulatory Visit: Payer: Self-pay | Admitting: Obstetrics and Gynecology

## 2023-02-05 DIAGNOSIS — R928 Other abnormal and inconclusive findings on diagnostic imaging of breast: Secondary | ICD-10-CM

## 2023-02-08 LAB — FECAL OCCULT BLOOD, IMMUNOCHEMICAL: Fecal Occult Bld: NEGATIVE

## 2023-02-20 ENCOUNTER — Ambulatory Visit
Admission: RE | Admit: 2023-02-20 | Discharge: 2023-02-20 | Disposition: A | Discharge status: Home / Self Care | Payer: No Typology Code available for payment source | Source: Ambulatory Visit | Attending: Obstetrics and Gynecology | Admitting: Obstetrics and Gynecology

## 2023-02-20 DIAGNOSIS — R928 Other abnormal and inconclusive findings on diagnostic imaging of breast: Secondary | ICD-10-CM

## 2023-02-21 ENCOUNTER — Other Ambulatory Visit: Payer: Self-pay | Admitting: Obstetrics and Gynecology

## 2023-02-21 DIAGNOSIS — R921 Mammographic calcification found on diagnostic imaging of breast: Secondary | ICD-10-CM

## 2023-02-28 ENCOUNTER — Ambulatory Visit
Admission: RE | Admit: 2023-02-28 | Discharge: 2023-02-28 | Disposition: A | Discharge status: Home / Self Care | Payer: No Typology Code available for payment source | Source: Ambulatory Visit | Attending: Obstetrics and Gynecology | Admitting: Obstetrics and Gynecology

## 2023-02-28 DIAGNOSIS — R921 Mammographic calcification found on diagnostic imaging of breast: Secondary | ICD-10-CM

## 2023-02-28 HISTORY — PX: BREAST BIOPSY: SHX20

## 2023-03-11 ENCOUNTER — Other Ambulatory Visit: Payer: Self-pay

## 2024-01-02 ENCOUNTER — Other Ambulatory Visit: Payer: Self-pay | Admitting: Obstetrics and Gynecology

## 2024-01-02 DIAGNOSIS — Z1231 Encounter for screening mammogram for malignant neoplasm of breast: Secondary | ICD-10-CM

## 2024-03-19 ENCOUNTER — Ambulatory Visit: Payer: Self-pay | Admitting: *Deleted

## 2024-03-19 ENCOUNTER — Ambulatory Visit
Admission: RE | Admit: 2024-03-19 | Discharge: 2024-03-19 | Disposition: A | Discharge status: Home / Self Care | Source: Ambulatory Visit | Attending: Obstetrics and Gynecology | Admitting: Obstetrics and Gynecology

## 2024-03-19 VITALS — BP 143/81 | Ht <= 58 in | Wt 190.0 lb

## 2024-03-19 DIAGNOSIS — Z1211 Encounter for screening for malignant neoplasm of colon: Secondary | ICD-10-CM

## 2024-03-19 DIAGNOSIS — Z1231 Encounter for screening mammogram for malignant neoplasm of breast: Secondary | ICD-10-CM

## 2024-03-19 DIAGNOSIS — Z1239 Encounter for other screening for malignant neoplasm of breast: Secondary | ICD-10-CM

## 2024-03-19 NOTE — Patient Instructions (Signed)
 Explained breast self awareness with Allison Daniels. Patient did not need a Pap smear today due to last Pap smear was 07/18/2021. Let her know [facility] will cover Pap smears every 3 years unless has a history of abnormal Pap smears. Referred patient to the [facility] of [facility] for a screening mammogram on mobile unit. Appointment scheduled Thursday, March 19, 2024 at 1330. Patient aware of appointment and will be there. Let patient know the Breast Center will follow up with her within the next couple weeks with results of mammogram by letter or phone. Allison Daniels verbalized understanding.  Tracy Gerken, Wanda Ship, RN 1:12 PM

## 2024-03-19 NOTE — Progress Notes (Signed)
 [name] is a 58 y.o. female who presents to [facility] clinic today with no complaints.    Pap Smear: Pap smear not completed today. Last Pap smear was 07/18/2021 at [facility] and was normal. Per patient has no history of an abnormal Pap smear. Last Pap smear result is available in Epic.   Physical exam: Breasts Breasts symmetrical. No skin abnormalities bilateral breasts. No nipple retraction bilateral breasts. No nipple discharge bilateral breasts. No lymphadenopathy. No lumps palpated bilateral breasts. No complaints of pain or tenderness on exam.  MM LT BREAST BX W LOC DEV 1ST LESION IMAGE BX SPEC STEREO GUIDE Addendum Date: 03/04/2023 ADDENDUM REPORT: 03/04/2023 11:13 ADDENDUM: PATHOLOGY revealed: Site 1. Breast, LEFT, needle core biopsy, upper outer, posterior extent (x clip) - FIBROCYSTIC CHANGES INCLUDING APOCRINE METAPLASIA, STROMAL FIBROSIS AND CYSTIC DILATATION WITH ASSOCIATED USUAL DUCTAL HYPERPLASIA (UDH) - SCLEROSING ADENOSIS - NEGATIVE FOR MALIGNANCY - SEE NOTE Pathology results are CONCORDANT with imaging findings, per Dr. Chyrl Phi. PATHOLOGY revealed: Site 2. Breast, LEFT, needle core biopsy, upper outer, anterior extent (coil clip) - FIBROCYSTIC CHANGES INCLUDING APOCRINE METAPLASIA, STROMAL FIBROSIS, AND CYSTIC DILATATION WITH FOCAL ASSOCIATED USUAL DUCTAL HYPERPLASIA (UDH) - RUPTURED DUCT WITH ASSOCIATED FOCAL ACUTE INFLAMMATION AND PERIDUCTULAR CHRONIC INFLAMMATION ADENOSIS - NEGATIVE FOR MALIGNANCY - SEE NOTE Pathology results are CONCORDANT with imaging findings, per Dr. Chyrl Phi. Pathology results and recommendations below were discussed with patient by telephone on 03/04/2023 using Spanish Interpre[employer] [place] [phone]. Patient reported biopsy site within normal limits with slight tenderness at the site, [employer] [phone] was used for this interaction on 03/01/2023. Post biopsy care instructions were reviewed, questions were answered and my direct  phone number was provided to patient. Patient was instructed to call [facility] if any concerns or questions arise related to the biopsy. RECOMMENDATION: The patient was instructed to return for annual May 2025 mammography. Pathology results reported by Arabella Borer, RN on 03/04/2023. Electronically Signed   By: Reyes Phi M.D.   On: 03/04/2023 11:13   Result Date: 03/04/2023 CLINICAL DATA:  58 year old female presents for tissue sampling of the anterior and posterior aspects of the 4 cm group of UPPER-OUTER LEFT breast calcifications. EXAM: LEFT BREAST STEREOTACTIC CORE NEEDLE BIOPSY X 2 COMPARISON:  Previous exam(s). FINDINGS: The patient and I discussed the procedure of stereotactic-guided biopsy including benefits and alternatives. We discussed the high likelihood of a successful procedure. We discussed the risks of the procedure including infection, bleeding, tissue injury, clip migration, and inadequate sampling. Informed written consent was given. The usual time out protocol was performed immediately prior to the procedure. LEFT BREAST STEREOTACTIC CORE NEEDLE BIOPSY (posterior aspect of UPPER-OUTER LEFT breast calcifications-X clip): Using sterile technique and 1% Lidocaine with and without epinephrine as local anesthetic, under stereotactic guidance, a 9 gauge vacuum assisted device was used to perform core needle biopsy of the posterior aspect of the 4 cm group of UPPER-OUTER LEFT breast calcifications using a SUPERIOR approach. Specimen radiograph was performed showing calcifications. Specimens with calcifications are identified for pathology. Lesion quadrant: UPPER-OUTER LEFT breast At the conclusion of the procedure, an X shaped tissue marker clip was deployed into the biopsy cavity. Follow-up 2-view mammogram was performed and dictated separately. LEFT BREAST STEREOTACTIC CORE NEEDLE BIOPSY (anterior aspect of UPPER-OUTER LEFT breast calcifications-COIL clip): Using sterile  technique and 1% Lidocaine with and without epinephrine as local anesthetic, under stereotactic guidance, a 9 gauge vacuum assisted device was used to perform core needle biopsy  of the anterior aspect of the 4 cm group of UPPER-OUTER LEFT breast calcifications using a SUPERIOR approach. Specimen radiograph was performed showing calcifications. Specimens with calcifications are identified for pathology. Lesion quadrant: UPPER-OUTER LEFT breast At the conclusion of the procedure, a COIL shaped tissue marker clip was deployed into the biopsy cavity. Follow-up 2-view mammogram was performed and dictated separately. IMPRESSION: Stereotactic-guided biopsy of posterior aspect of 4 cm group of UPPER-OUTER LEFT breast calcifications (X clip). Stereotactic guided biopsy of anterior aspect of 4 cm group of UPPER-OUTER LEFT breast calcifications (COIL clip). No apparent complications. Electronically Signed: By: Reyes Phi M.D. On: 02/28/2023 11:22  MM LT BREAST BX W LOC DEV EA AD LESION IMG BX SPEC STEREO GUIDE Addendum Date: 03/04/2023 ADDENDUM REPORT: 03/04/2023 11:13 ADDENDUM: PATHOLOGY revealed: Site 1. Breast, LEFT, needle core biopsy, upper outer, posterior extent (x clip) - FIBROCYSTIC CHANGES INCLUDING APOCRINE METAPLASIA, STROMAL FIBROSIS AND CYSTIC DILATATION WITH ASSOCIATED USUAL DUCTAL HYPERPLASIA (UDH) - SCLEROSING ADENOSIS - NEGATIVE FOR MALIGNANCY - SEE NOTE Pathology results are CONCORDANT with imaging findings, per Dr. Chyrl Phi. PATHOLOGY revealed: Site 2. Breast, LEFT, needle core biopsy, upper outer, anterior extent (coil clip) - FIBROCYSTIC CHANGES INCLUDING APOCRINE METAPLASIA, STROMAL FIBROSIS, AND CYSTIC DILATATION WITH FOCAL ASSOCIATED USUAL DUCTAL HYPERPLASIA (UDH) - RUPTURED DUCT WITH ASSOCIATED FOCAL ACUTE INFLAMMATION AND PERIDUCTULAR CHRONIC INFLAMMATION ADENOSIS - NEGATIVE FOR MALIGNANCY - SEE NOTE Pathology results are CONCORDANT with imaging findings, per Dr. Chyrl Phi. Pathology results and  recommendations below were discussed with patient by telephone on 03/04/2023 using Spanish [employer]pre[employer] [place] [phone]. Patient reported biopsy site within normal limits with slight tenderness at the site, [employer] [phone] was used for this interaction on 03/01/2023. Post biopsy care instructions were reviewed, questions were answered and my direct phone number was provided to patient. Patient was instructed to call [facility] if any concerns or questions arise related to the biopsy. RECOMMENDATION: The patient was instructed to return for annual May 2025 mammography. Pathology results reported by Arabella Borer, RN on 03/04/2023. Electronically Signed   By: Reyes Phi M.D.   On: 03/04/2023 11:13   Result Date: 03/04/2023 CLINICAL DATA:  59 year old female presents for tissue sampling of the anterior and posterior aspects of the 4 cm group of UPPER-OUTER LEFT breast calcifications. EXAM: LEFT BREAST STEREOTACTIC CORE NEEDLE BIOPSY X 2 COMPARISON:  Previous exam(s). FINDINGS: The patient and I discussed the procedure of stereotactic-guided biopsy including benefits and alternatives. We discussed the high likelihood of a successful procedure. We discussed the risks of the procedure including infection, bleeding, tissue injury, clip migration, and inadequate sampling. Informed written consent was given. The usual time out protocol was performed immediately prior to the procedure. LEFT BREAST STEREOTACTIC CORE NEEDLE BIOPSY (posterior aspect of UPPER-OUTER LEFT breast calcifications-X clip): Using sterile technique and 1% Lidocaine with and without epinephrine as local anesthetic, under stereotactic guidance, a 9 gauge vacuum assisted device was used to perform core needle biopsy of the posterior aspect of the 4 cm group of UPPER-OUTER LEFT breast calcifications using a SUPERIOR approach. Specimen radiograph was performed showing calcifications. Specimens with calcifications are  identified for pathology. Lesion quadrant: UPPER-OUTER LEFT breast At the conclusion of the procedure, an X shaped tissue marker clip was deployed into the biopsy cavity. Follow-up 2-view mammogram was performed and dictated separately. LEFT BREAST STEREOTACTIC CORE NEEDLE BIOPSY (anterior aspect of UPPER-OUTER LEFT breast calcifications-COIL clip): Using sterile technique and 1% Lidocaine with and without epinephrine as local anesthetic,  under stereotactic guidance, a 9 gauge vacuum assisted device was used to perform core needle biopsy of the anterior aspect of the 4 cm group of UPPER-OUTER LEFT breast calcifications using a SUPERIOR approach. Specimen radiograph was performed showing calcifications. Specimens with calcifications are identified for pathology. Lesion quadrant: UPPER-OUTER LEFT breast At the conclusion of the procedure, a COIL shaped tissue marker clip was deployed into the biopsy cavity. Follow-up 2-view mammogram was performed and dictated separately. IMPRESSION: Stereotactic-guided biopsy of posterior aspect of 4 cm group of UPPER-OUTER LEFT breast calcifications (X clip). Stereotactic guided biopsy of anterior aspect of 4 cm group of UPPER-OUTER LEFT breast calcifications (COIL clip). No apparent complications. Electronically Signed: By: Reyes Phi M.D. On: 02/28/2023 11:22  MM CLIP PLACEMENT LEFT Result Date: 02/28/2023 CLINICAL DATA:  Evaluate placement of biopsy clips following stereotactic guided LEFT breast biopsies. EXAM: 3D DIAGNOSTIC LEFT MAMMOGRAM POST STEREOTACTIC BIOPSY COMPARISON:  Previous exam(s). FINDINGS: 3D Mammographic images were obtained following LEFT guided biopsy of the posterior aspect of UPPER-OUTER LEFT breast calcifications (X clip) in the anterior aspect of UPPER-OUTER LEFT breast calcifications (COIL clip). The X biopsy marking clip is in expected position at the site of biopsy. The COIL biopsy marking clip is in expected position at the site of biopsy. The 2  clips are separated by a distance of 3.5 cm. IMPRESSION: Appropriate positioning of the X shaped biopsy marking clip at the site of biopsy in the UPPER OUTER LEFT breast. Appropriate positioning of the COIL shaped biopsy marking clip at the site of biopsy in the UPPER OUTER LEFT breast. Final Assessment: Post Procedure Mammograms for Marker Placement Electronically Signed   By: Reyes Phi M.D.   On: 02/28/2023 11:28  MS DIGITAL DIAG UNI LEFT Result Date: 02/20/2023 CLINICAL DATA:  58 year old female presents for further evaluation of LEFT breast calcifications identified on screening mammogram. EXAM: DIGITAL DIAGNOSTIC UNILATERAL LEFT MAMMOGRAM WITH CAD TECHNIQUE: Left digital diagnostic mammography was performed. COMPARISON:  Previous exam(s). ACR Breast Density Category b: There are scattered areas of fibroglandular density. FINDINGS: Loosely grouped primarily round calcifications within the anterior UPPER-OUTER LEFT breast are noted spanning a distance of 4 cm. No layering is noted. IMPRESSION: Indeterminate 4 cm group of UPPER-OUTER LEFT breast calcifications. Tissue sampling of the anterior and posterior aspects of these calcifications recommended. RECOMMENDATION: Two site stereotactic guided LEFT breast biopsy, which will be scheduled. I have discussed the findings and recommendations with the patient through an interpreter. If applicable, a reminder letter will be sent to the patient regarding the next appointment. BI-RADS CATEGORY  4: Suspicious. Electronically Signed   By: Reyes Phi M.D.   On: 02/20/2023 16:34  MS 3D SCR MAMMO BILAT BR (aka MM) Result Date: 02/04/2023 CLINICAL DATA:  Screening. EXAM: DIGITAL SCREENING BILATERAL MAMMOGRAM WITH TOMOSYNTHESIS AND CAD TECHNIQUE: Bilateral screening digital craniocaudal and mediolateral oblique mammograms were obtained. Bilateral screening digital breast tomosynthesis was performed. The images were evaluated with computer-aided detection. COMPARISON:   Previous exam(s). ACR Breast Density Category b: There are scattered areas of fibroglandular density. FINDINGS: In the left breast, calcifications warrant further evaluation. In the right breast, no findings suspicious for malignancy. IMPRESSION: Further evaluation is suggested for calcifications in the left breast. RECOMMENDATION: Diagnostic mammogram of the left breast. (Code:[id number]) The patient will be contacted regarding the findings, and additional imaging will be scheduled. BI-RADS CATEGORY  0: Incomplete: Need additional imaging evaluation. Electronically Signed   By: Dina  Arceo M.D.   On: 02/04/2023 10:27  Pelvic/Bimanual Pap is not indicated today per BCCCP guidelines.   Smoking History: Patient has never smoked.   Patient Navigation: Patient education provided. Access to services provided for patient through BCCCP program.   Colorectal Cancer Screening: Per patient has never had colonoscopy completed. Patient completed a FIT Test 02/03/2023 that was negative. FIT Test given to patient to complete today. No complaints today.    Breast and Cervical Cancer Risk Assessment: Patient does not have family history of breast cancer, known genetic mutations, or radiation treatment to the chest before age 89. Patient does not have history of cervical dysplasia, immunocompromised, or DES exposure in-utero.  Risk Scores as of Encounter on 03/19/2024     Alisa           5-year 1.32%   Lifetime 8.35%            Last calculated by Silas, Ansyi K, CMA on 03/19/2024 at  1:04 PM        A: BCCCP exam without pap smear No complaints.  P: Referred patient to the [facility] for a screening mammogram on mobile unit. Appointment scheduled Thursday, March 19, 2024 at 1330.  Driscilla Wanda SQUIBB, RN 03/19/2024 1:12 PM

## 2024-03-26 ENCOUNTER — Other Ambulatory Visit: Payer: Self-pay | Admitting: Obstetrics and Gynecology

## 2024-03-26 DIAGNOSIS — R928 Other abnormal and inconclusive findings on diagnostic imaging of breast: Secondary | ICD-10-CM

## 2024-03-30 ENCOUNTER — Ambulatory Visit
Admission: RE | Admit: 2024-03-30 | Discharge: 2024-03-30 | Disposition: A | Discharge status: Home / Self Care | Source: Ambulatory Visit | Attending: Obstetrics and Gynecology | Admitting: Obstetrics and Gynecology

## 2024-03-30 DIAGNOSIS — R928 Other abnormal and inconclusive findings on diagnostic imaging of breast: Secondary | ICD-10-CM

## 2024-05-19 ENCOUNTER — Telehealth: Payer: Self-pay | Admitting: General Practice

## 2024-05-19 NOTE — Telephone Encounter (Signed)
 Patient would like to be on wait list for sooner new patient appointment

## 2024-06-08 NOTE — Telephone Encounter (Signed)
 Patient has been scheduled for 06/09/24 at 3:00 PM.

## 2024-06-09 ENCOUNTER — Ambulatory Visit: Payer: Self-pay | Admitting: Internal Medicine

## 2024-06-09 ENCOUNTER — Encounter: Payer: Self-pay | Admitting: Internal Medicine

## 2024-06-09 VITALS — BP 140/90 | HR 80 | Resp 17 | Ht 59.0 in | Wt 182.0 lb

## 2024-06-09 DIAGNOSIS — E782 Mixed hyperlipidemia: Secondary | ICD-10-CM

## 2024-06-09 DIAGNOSIS — Z5948 Other specified lack of adequate food: Secondary | ICD-10-CM

## 2024-06-09 DIAGNOSIS — E7800 Pure hypercholesterolemia, unspecified: Secondary | ICD-10-CM | POA: Insufficient documentation

## 2024-06-09 DIAGNOSIS — E119 Type 2 diabetes mellitus without complications: Secondary | ICD-10-CM | POA: Insufficient documentation

## 2024-06-09 DIAGNOSIS — I10 Essential (primary) hypertension: Secondary | ICD-10-CM

## 2024-06-09 DIAGNOSIS — E785 Hyperlipidemia, unspecified: Secondary | ICD-10-CM | POA: Insufficient documentation

## 2024-06-09 DIAGNOSIS — N951 Menopausal and female climacteric states: Secondary | ICD-10-CM

## 2024-06-09 MED ORDER — LANCETS MISC. MISC: 11 refills | Status: AC

## 2024-06-09 MED ORDER — BLOOD GLUCOSE MONITORING SUPPL DEVI: 0 refills | Status: AC

## 2024-06-09 MED ORDER — LANCET DEVICE MISC: 0 refills | Status: AC

## 2024-06-09 MED ORDER — BLOOD GLUCOSE TEST VI STRP: ORAL_STRIP | 11 refills | Status: AC

## 2024-06-09 MED ORDER — EMPAGLIFLOZIN 10 MG PO TABS: 10.0000 mg | ORAL_TABLET | Freq: Every day | ORAL | 11 refills | Status: AC

## 2024-06-09 MED ORDER — HYDROCHLOROTHIAZIDE 25 MG PO TABS: 25.0000 mg | ORAL_TABLET | Freq: Every day | ORAL | 3 refills | Status: AC

## 2024-06-09 MED ORDER — LISINOPRIL 40 MG PO TABS: 40.0000 mg | ORAL_TABLET | Freq: Every day | ORAL | 11 refills | Status: AC

## 2024-06-09 MED ORDER — OZEMPIC (0.25 OR 0.5 MG/DOSE) 2 MG/3ML ~~LOC~~ SOPN: PEN_INJECTOR | SUBCUTANEOUS | 11 refills | Status: DC

## 2024-06-09 MED ORDER — METFORMIN HCL 1000 MG PO TABS: 1000.0000 mg | ORAL_TABLET | Freq: Two times a day (BID) | ORAL | 11 refills | Status: DC

## 2024-06-09 NOTE — Progress Notes (Signed)
 Subjective:    Patient ID: Allison Daniels, female   DOB: Mar 30, 1966, 58 y.o.   MRN: [id number]   HPI  Here to establish  Erminio Bloomer interprets   DM:  diagnosed about 6 years ago after husband died.  She is not familiar with the term A1C.  Last visit at [facility] in December.  Currently taking only Metformin  1000 mg twice daily.   She is not aware of any diabetic complications No eye check for years Has not had an influenza vaccine this year.   Last labs in December as well.    Diet: First meal at 9 am:  zucchini--boiled with lemon juice.  Wheat toast.  Boiled eggs.  Chamomile tea.    Afternoon:  Papaya or if hungry, will grill chicken with veggies.  Water.    Dinner:  last night had wings, cauliflower salad and celery.  Water.   Does not drink soda.   Rare snacking--generally veggies if does.  Walks for 20 minutes daily when daughter about to come home from school.    Her current weight has been relatively stable for many years--8 y perhaps.   2.  Hypertension:  Diagnosed about 13 years ago.  Lisinopril  20 mg daily and hydrochlorothiazide  25 mg daily.  Always runs a bit high.  Never in 120/70 range.    3.  Concern for menopause:  Has not had a period for 8 years, but still with hot flashes and night sweats.  Drinking different teas. Nothing has helped.    4.  ?Hyperlipidemia:  But no Rx for Simvastatin being taken currently.  She states she was not aware.    5.  Right shoulder pain:  has not been working due to shoulder pain and she felt she was working too much.  Improved since not working and has more range of motion.    Current Meds  Medication Sig   hydrochlorothiazide  (HYDRODIURIL ) 25 MG tablet Take 25 mg by mouth daily.   lisinopril  (ZESTRIL ) 20 MG tablet Take 20 mg by mouth daily.   metFORMIN  (GLUCOPHAGE ) 500 MG tablet Take 500 mg by mouth 2 (two) times daily with a meal. (Patient taking differently: Take 500 mg by mouth 2 (two) times daily with a  meal. 2 tabs by mouth twice daily with meals)   [DISCONTINUED] lisinopril  (PRINIVIL ,ZESTRIL ) 10 MG tablet Take 20 mg by mouth daily.   No Known Allergies  Past Medical History:  Diagnosis Date   Diabetes mellitus without complication (HCC)    Hyperlipidemia    Hypertension    Past Surgical History:  Procedure Laterality Date   BREAST BIOPSY Left 02/28/2023   MM LT BREAST BX W LOC DEV 1ST LESION IMAGE BX SPEC STEREO GUIDE 02/28/2023 [facility]   BREAST BIOPSY Left 02/28/2023   MM LT BREAST BX W LOC DEV EA AD LESION IMG BX SPEC STEREO GUIDE 02/28/2023 [facility]   Family History  Problem Relation Age of Onset   Cancer Mother        Pancreatic cancer   Alcohol abuse Father    Cirrhosis Father        Alcoholic   Diabetes Sister        complications cause of death   Hypertension Sister    Diabetes Brother    Diabetes Brother    Breast cancer Neg Hx    Social History   Socioeconomic History   Marital status: Widowed    Spouse name: Not on file   Number  of children: 2   Years of education: 12   Highest education level: 12th grade  Occupational History   Occupation: currently not working    Comment: Her shoulder hurts  Tobacco Use   Smoking status: Never    Passive exposure: Past   Smokeless tobacco: Never  Vaping Use   Vaping status: Never Used  Substance and Sexual Activity   Alcohol use: Not Currently    Comment: ocassional   Drug use: Never   Sexual activity: Not Currently    Birth control/protection: Post-menopausal  Other Topics Concern   Not on file  Social History Narrative   Husband died from a stroke and suffered from DM and hypertension    Lives at home with her younger daughter.   Social Drivers of Corporate investment banker Strain: Not on file  Food Insecurity: Food Insecurity Present (03/19/2024)   Hunger Vital Sign    Worried About Running Out of Food in the Last Year: Sometimes true    Ran Out of Food in the Last Year: Sometimes  true  Transportation Needs: Unmet Transportation Needs (03/19/2024)   PRAPARE - Administrator, Civil Service (Medical): Yes    Lack of Transportation (Non-Medical): Yes  Physical Activity: Not on file  Stress: Not on file  Social Connections: Not on file  Intimate Partner Violence: Not on file      Review of Systems    Objective:   BP (!) 140/90 (BP Location: Left Arm, Patient Position: Sitting, Cuff Size: Normal)   Pulse 80   Resp 17   Ht 4' 11 (1.499 m)   Wt 182 lb (82.6 kg)   BMI 36.76 kg/m   Physical Exam Constitutional:      Appearance: She is obese.  HENT:     Head: Normocephalic and atraumatic.     Right Ear: Tympanic membrane, ear canal and external ear normal.     Left Ear: Tympanic membrane, ear canal and external ear normal.     Nose: Nose normal.     Mouth/Throat:     Mouth: Mucous membranes are moist.     Pharynx: Oropharynx is clear.  Eyes:     Extraocular Movements: Extraocular movements intact.     Conjunctiva/sclera: Conjunctivae normal.     Pupils: Pupils are equal, round, and reactive to light.     Comments: Discs sharp  Neck:     Thyroid: No thyroid mass or thyromegaly.  Cardiovascular:     Rate and Rhythm: Normal rate and regular rhythm.     Heart sounds: S1 normal and S2 normal. No murmur heard.    No friction rub. No S3 or S4 sounds.     Comments: No carotid bruits.  Carotid, radial, femoral, DP and PT pulses normal and equal.   Pulmonary:     Effort: Pulmonary effort is normal.     Breath sounds: Normal breath sounds and air entry.  Abdominal:     General: Bowel sounds are normal.     Palpations: Abdomen is soft. There is no hepatomegaly, splenomegaly or mass.     Tenderness: There is no abdominal tenderness.     Hernia: No hernia is present.  Musculoskeletal:     Cervical back: Normal range of motion and neck supple.     Right lower leg: No edema.     Left lower leg: No edema.  Lymphadenopathy:     Head:     Right  side of head: No submental or  submandibular adenopathy.     Left side of head: No submental or submandibular adenopathy.     Cervical: No cervical adenopathy.  Skin:    General: Skin is warm.     Capillary Refill: Capillary refill takes less than 2 seconds.     Findings: No rash.  Neurological:     General: No focal deficit present.     Mental Status: She is alert and oriented to person, place, and time.  Psychiatric:        Mood and Affect: Mood normal.        Behavior: Behavior normal.      Assessment & Plan   DM:  A1C.  Start Ozempic  0.25 mg once weekly and Jardiance  10 mg daily.  To call when she has the Ozempic  and will follow up 2 weeks later for weight check with sugar log.  Continue Metformin  for now.  Eye referral  2.  Hypertension:  Increase Lisinopril  to 40 mg daily and maintain hydrochlorothiazide  25 mg daily. BP check with follow up for Ozempic  start.  3.  Hyperlipidemia:  Not sure how significant this is, though statin in med list, though not taking.  FLP.  4.  Menopausal symptoms:  Regular physical activity and weight loss.    5.  Food insecurity:  referral to [employer] and [employer].    6.  See SW screening:  LCSWA will contact patient for counseling.

## 2024-06-09 NOTE — Patient Instructions (Signed)
 Call office when you have the Ozempic  and we will have you follow up in 2 weeks after start first injection

## 2024-06-11 NOTE — Progress Notes (Signed)
 SDOH Screenings   Food Insecurity: Food Insecurity Present (06/11/2024)  Housing: High Risk (06/11/2024)  Transportation Needs: Unmet Transportation Needs (06/11/2024)  Utilities: At Risk (06/11/2024)  Tobacco Use: Low Risk  (06/09/2024)      06/11/2024   10:08 AM 04/07/2018    2:03 PM  Depression screen PHQ 2/9  Decreased Interest 1 3  Down, Depressed, Hopeless 0 3  PHQ - 2 Score 1 6  Altered sleeping 0   Tired, decreased energy 2   Change in appetite 1   Feeling bad or failure about yourself  1   Trouble concentrating 0   Moving slowly or fidgety/restless 0   Suicidal thoughts 0   PHQ-9 Score 5   Difficult doing work/chores Somewhat difficult       06/11/2024   10:09 AM  GAD 7 : Generalized Anxiety Score  Nervous, Anxious, on Edge 1  Control/stop worrying 1  Worry too much - different things 0  Trouble relaxing 0  Restless 0  Easily annoyed or irritable 1  Afraid - awful might happen 0  Total GAD 7 Score 3  Anxiety Difficulty Somewhat difficult   Client is in needs of case management and therapeutical services. [facility] will contact about case management and social work will contact about therapy. Dr. Adella was informed.   Camie Norris [place], LCSWA

## 2024-06-12 ENCOUNTER — Other Ambulatory Visit: Payer: Self-pay

## 2024-06-12 DIAGNOSIS — I10 Essential (primary) hypertension: Secondary | ICD-10-CM

## 2024-06-12 DIAGNOSIS — E119 Type 2 diabetes mellitus without complications: Secondary | ICD-10-CM

## 2024-06-12 DIAGNOSIS — E782 Mixed hyperlipidemia: Secondary | ICD-10-CM

## 2024-06-13 LAB — LIPID PANEL
Chol/HDL Ratio: 2.9 ratio (ref 0.0–4.4)
Cholesterol, Total: 182 mg/dL (ref 100–199)
HDL: 62 mg/dL (ref >39)
LDL Chol Calc (NIH): 99 mg/dL (ref 0–99)
Triglycerides: 116 mg/dL (ref 0–149)
VLDL Cholesterol Cal: 21 mg/dL (ref 5–40)

## 2024-06-13 LAB — COMPREHENSIVE METABOLIC PANEL WITH GFR
ALT: 60 IU/L — ABNORMAL HIGH (ref 0–32)
AST: 66 IU/L — ABNORMAL HIGH (ref 0–40)
Albumin: 4.1 g/dL (ref 3.8–4.9)
Alkaline Phosphatase: 119 IU/L (ref 49–135)
BUN/Creatinine Ratio: 20 (ref 9–23)
BUN: 13 mg/dL (ref 6–24)
Bilirubin Total: 0.7 mg/dL (ref 0.0–1.2)
CO2: 22 mmol/L (ref 20–29)
Calcium: 9.3 mg/dL (ref 8.7–10.2)
Chloride: 100 mmol/L (ref 96–106)
Creatinine, Ser: 0.64 mg/dL (ref 0.57–1.00)
Globulin, Total: 4 g/dL (ref 1.5–4.5)
Glucose: 147 mg/dL — ABNORMAL HIGH (ref 70–99)
Potassium: 3.9 mmol/L (ref 3.5–5.2)
Sodium: 139 mmol/L (ref 134–144)
Total Protein: 8.1 g/dL (ref 6.0–8.5)
eGFR: 102 mL/min/1.73 (ref >59)

## 2024-06-13 LAB — CBC WITH DIFFERENTIAL/PLATELET
Basophils Absolute: 0.1 x10E3/uL (ref 0.0–0.2)
Basos: 1 %
EOS (ABSOLUTE): 0.2 x10E3/uL (ref 0.0–0.4)
Eos: 2 %
Hematocrit: 40.8 % (ref 34.0–46.6)
Hemoglobin: 13.2 g/dL (ref 11.1–15.9)
Immature Grans (Abs): 0 x10E3/uL (ref 0.0–0.1)
Immature Granulocytes: 0 %
Lymphocytes Absolute: 2.5 x10E3/uL (ref 0.7–3.1)
Lymphs: 35 %
MCH: 28.9 pg (ref 26.6–33.0)
MCHC: 32.4 g/dL (ref 31.5–35.7)
MCV: 89 fL (ref 79–97)
Monocytes Absolute: 0.6 x10E3/uL (ref 0.1–0.9)
Monocytes: 9 %
Neutrophils Absolute: 3.7 x10E3/uL (ref 1.4–7.0)
Neutrophils: 53 %
Platelets: 272 x10E3/uL (ref 150–450)
RBC: 4.57 x10E6/uL (ref 3.77–5.28)
RDW: 14 % (ref 11.7–15.4)
WBC: 7 x10E3/uL (ref 3.4–10.8)

## 2024-06-13 LAB — HEMOGLOBIN A1C
Est. average glucose Bld gHb Est-mCnc: 197 mg/dL
Hgb A1c MFr Bld: 8.5 % — ABNORMAL HIGH (ref 4.8–5.6)

## 2024-06-13 LAB — TSH: TSH: 1.7 u[IU]/mL (ref 0.450–4.500)

## 2024-06-13 LAB — MICROALBUMIN / CREATININE URINE RATIO
Creatinine, Urine: 104.3 mg/dL
Microalb/Creat Ratio: 12 mg/g{creat} (ref 0–29)
Microalbumin, Urine: 12.9 ug/mL

## 2024-06-16 ENCOUNTER — Ambulatory Visit: Payer: Self-pay | Admitting: Internal Medicine

## 2024-06-16 DIAGNOSIS — N951 Menopausal and female climacteric states: Secondary | ICD-10-CM | POA: Insufficient documentation

## 2024-06-16 DIAGNOSIS — Z5948 Other specified lack of adequate food: Secondary | ICD-10-CM | POA: Insufficient documentation

## 2024-06-16 DIAGNOSIS — R748 Abnormal levels of other serum enzymes: Secondary | ICD-10-CM | POA: Insufficient documentation

## 2024-06-25 ENCOUNTER — Other Ambulatory Visit: Payer: Self-pay

## 2024-07-10 ENCOUNTER — Other Ambulatory Visit: Payer: Self-pay

## 2024-07-24 ENCOUNTER — Other Ambulatory Visit: Payer: Self-pay

## 2024-07-31 ENCOUNTER — Other Ambulatory Visit: Payer: Self-pay

## 2024-07-31 NOTE — Progress Notes (Signed)
 Patient reports that she has been on Ozempic  .25mg  weekly on Fridays for about 5 weeks. Patient continues to take metformin  and Jardiance  as prescribed.   Patient instructed to talk metformin  half tab (500mg ) twice daily and return in 2 weeks. Call if sugar over 150

## 2024-08-17 ENCOUNTER — Telehealth: Payer: Self-pay

## 2024-08-17 ENCOUNTER — Other Ambulatory Visit (INDEPENDENT_AMBULATORY_CARE_PROVIDER_SITE_OTHER): Payer: Self-pay

## 2024-08-17 MED ORDER — OZEMPIC (0.25 OR 0.5 MG/DOSE) 2 MG/3ML ~~LOC~~ SOPN: PEN_INJECTOR | SUBCUTANEOUS | 11 refills | Status: DC

## 2024-08-17 NOTE — Progress Notes (Signed)
 Today weight 183 lb  Last time = 176 lb  Sugar ranges  AM = 92-128  PM= 98-134   She is getting Metformin  500 in the morning and 500 in the evening, Jardiance  10 , and Ozempic  0.25 for 8 weeks.   The patients says she feels back pain.   Patient is ok with increasing the does to 0.5 of Ozempic .  Get her back in 2 weeks for weight check and sugar log.

## 2024-08-21 ENCOUNTER — Telehealth: Payer: Self-pay

## 2024-08-21 NOTE — Telephone Encounter (Signed)
 Patient has been notified to increase Ozempic  to 0.5MG  , patient has been scheduled for 09/02/24.

## 2024-08-21 NOTE — Telephone Encounter (Signed)
 Patient called to report that she has had left flank pain right under her rib cage. Pain is constant since 08/15/2024. Patient has been taking ibuprofen 400mg  BID which stops pain, but pain returns if she stops taking medication. Patient also has itching in the area where she fells pain, but has not noticed anything abnormal on the skin.

## 2024-08-21 NOTE — Telephone Encounter (Signed)
 Patient has been scheduled for an acute visit. Patient instructed to visit ED/urgent care if experiences hematuria, fever or increasing pain.

## 2024-08-25 ENCOUNTER — Ambulatory Visit: Payer: Self-pay | Admitting: Internal Medicine

## 2024-08-25 VITALS — BP 180/100 | HR 82 | Ht 59.0 in | Wt 175.0 lb

## 2024-08-25 DIAGNOSIS — R109 Unspecified abdominal pain: Secondary | ICD-10-CM

## 2024-08-25 DIAGNOSIS — E119 Type 2 diabetes mellitus without complications: Secondary | ICD-10-CM

## 2024-08-25 DIAGNOSIS — R21 Rash and other nonspecific skin eruption: Secondary | ICD-10-CM

## 2024-08-25 DIAGNOSIS — I10 Essential (primary) hypertension: Secondary | ICD-10-CM

## 2024-08-25 LAB — POCT URINALYSIS DIPSTICK
Bilirubin, UA: NEGATIVE
Blood, UA: NEGATIVE
Glucose, UA: POSITIVE — AB
Ketones, UA: 15
Leukocytes, UA: NEGATIVE
Nitrite, UA: NEGATIVE
Protein, UA: NEGATIVE
Spec Grav, UA: >=1.03 — AB (ref 1.010–1.025)
Urobilinogen, UA: 0.2 U/dL
pH, UA: 6 (ref 5.0–8.0)

## 2024-08-25 NOTE — Progress Notes (Unsigned)
 Allison Daniels interprets

## 2024-08-25 NOTE — Progress Notes (Unsigned)
 Acute Office Visit  Subjective:  I, MD in training, did initial H/P then presented the pt to Dr. CHRISTELLA who also saw the pt and hexosamine her. Final A/P, orders by Dr. CHRISTELLA.  Verbal discharge instructions by me.  Written discharge instructions by Dr. CHRISTELLA.  Pt expressed understanding and agreed with the plan.    Patient ID: Allison Daniels, female    DOB: [birth year], 58 y.o.   MRN: [id number]  Chief Complaint  Patient presents with   Flank Pain    Left sided abdomenal pain for 10 days. 10 days ago, went to [employer] to see daughter where she had beef tacos and wings. After 1 hour she had the pain. Gradual onset. Later felt like the left abdomen was going numb. She took ibuprofen and it made it go away.  Took ibuprofen until yesterday. Since then, pain has gotten less. Constant pain. Radiates to the left flank. Never had this before.  Worsening factors: not taking meds, pushing on it.  Not affected by eating or movement.   Has been tolerating taco, etc.  No new nausea, vomiting, diarrhea, constipation, blood in stool, blood in the urine, frequency, urgency; It has affected her sleep some;  has to take tea to go to sleep. No vaginal bleeding or pain with intercourse.  No unintentional weight loss.   Injects semiglutide; rotates it on the abdomen.  That day she had injected on the right side.  It is week 9. Sxs started prior to dose increase of semiglutide.   No colonscopy on file. Last FIT 5/24 neg  Fhx- No stomach issues.    Patient is in today for   No Known Allergies  Current Meds  Medication Sig   Blood Glucose Monitoring Suppl DEVI May substitute to any manufacturer covered by patient's insurance.  Check blood glucose twice daily before meals and document   empagliflozin  (JARDIANCE ) 10 MG TABS tablet Take 1 tablet (10 mg total) by mouth daily before breakfast.   Glucose Blood (BLOOD GLUCOSE TEST STRIPS) STRP May substitute to any manufacturer covered by patient's insurance. Check  blood glucose twice daily before meals and document   hydrochlorothiazide  (HYDRODIURIL ) 25 MG tablet Take 1 tablet (25 mg total) by mouth daily.   ibuprofen (ADVIL) 400 MG tablet Take 400 mg by mouth 3 (three) times daily.   Lancet Device MISC May substitute to any manufacturer covered by patient's insurance.Check blood glucose twice daily before meals and document   Lancets Misc. MISC May substitute to any manufacturer covered by patient's insurance.  Check blood glucose twice daily before meals and document   lisinopril  (ZESTRIL ) 40 MG tablet Take 1 tablet (40 mg total) by mouth daily.   metFORMIN  (GLUCOPHAGE ) 1000 MG tablet Take 1 tablet (1,000 mg total) by mouth 2 (two) times daily with a meal.   Semaglutide ,0.25 or 0.5MG /DOS, (OZEMPIC , 0.25 OR 0.5 MG/DOSE,) 2 MG/3ML SOPN Inject 0.5 mg once weekly subcutaneously    Patient Active Problem List   Diagnosis Date Noted   Elevated liver enzymes 06/16/2024   Lack of access to adequate food 06/16/2024   Menopausal symptoms 06/16/2024   Hypertension    Elevated LDL cholesterol level    Type 2 diabetes mellitus without complication, without long-term current use of insulin [facility])     Past Surgical History:  Procedure Laterality Date   BREAST BIOPSY Left 02/28/2023   MM LT BREAST BX W LOC DEV 1ST LESION IMAGE BX SPEC STEREO GUIDE 02/28/2023 [facility]   BREAST BIOPSY  Left 02/28/2023   MM LT BREAST BX W LOC DEV EA AD LESION IMG BX SPEC STEREO GUIDE 02/28/2023 [facility]       Review of Systems  Constitutional:  Positive for weight loss (slight due to ozempec). Negative for chills and fever.  Gastrointestinal:        See HPI  Genitourinary:        See HPI;   Skin:  Positive for rash (stomach, itchy).  Endo/Heme/Allergies:        Last Hgb A1C 8.5     Objective:    BP (!) 190/100 (BP Location: Right Arm, Patient Position: Sitting, Cuff Size: Normal)   Pulse 82   Ht 4' 11 (1.499 m)   Wt 175 lb (79.4 kg)   SpO2 (!) 18%    BMI 35.35 kg/m  Physical Exam Vitals and nursing note reviewed.  Constitutional:      General: She is not in acute distress.    Appearance: Normal appearance. She is not ill-appearing, toxic-appearing or diaphoretic.     Comments: High BMI  HENT:     Mouth/Throat:     Comments: No swelling of lips; No stridor Eyes:     General: No scleral icterus.    Conjunctiva/sclera: Conjunctivae normal.  Pulmonary:     Effort: Pulmonary effort is normal.     Breath sounds: Normal breath sounds.  Abdominal:     General: There is no distension.     Palpations: Abdomen is soft. There is no mass.     Tenderness: There is abdominal tenderness (LLQ area of highest tenderness and it is mild with deep palapation. The LUQ also slightly tender.  Very mild tenderness of RUQ). There is left CVA tenderness (mild). There is no guarding.     Hernia: No hernia (no obvious abdomenal wall hernia) is present.  Musculoskeletal:     Comments: No thoracic or lumbar tenderness. Can twist at the trunk w/o exacerbation of pain  Skin:    Coloration: Skin is not jaundiced.     Findings: No bruising or lesion.     Comments: Maculopapular, confluent rash 3 by 8 inches at the junction of LUQ and LLQ without vesicles  Neurological:     Mental Status: She is alert.  Psychiatric:        Mood and Affect: Mood normal.        Behavior: Behavior normal.        Thought Content: Thought content normal.        Judgment: Judgment normal.       Assessment & Plan:  Acute left sided abdomenal pain- Getting better Allergic reaction vs mild colitis vs mild diverticulitis vs DM-related vs Py lo -Acute abdomen by history and exam -Stop Ibuprofen -RTC in 2 days for recheck.   Call clinic if worse or go to Urgent Care during off hours or to ED if really bad.  -UA, UCx  2. Acute Hypertension-  RTC in 2 days with RN for recheck  3- Acute rash accompanying abdomenal pain - Maybe localized allergic reaction to Ozempec or related to  the abdomenal sx's- - Monitor  4- DM- Check blood sugars at home and bring them with her.   5- HM- -Next appt 1/26 - schedule covid shot in 7 days if abdomenal pain is gone by then,   Caron Kaiser, MD

## 2024-08-25 NOTE — Patient Instructions (Addendum)
 No ibuprofen Cool compress to rash Call clinic if pain or rash worsens

## 2024-08-27 LAB — URINE CULTURE

## 2024-09-02 ENCOUNTER — Other Ambulatory Visit: Payer: Self-pay

## 2024-09-02 VITALS — BP 170/90 | HR 91 | Wt 170.0 lb

## 2024-09-02 DIAGNOSIS — E119 Type 2 diabetes mellitus without complications: Secondary | ICD-10-CM

## 2024-09-02 DIAGNOSIS — R109 Unspecified abdominal pain: Secondary | ICD-10-CM

## 2024-09-02 DIAGNOSIS — R21 Rash and other nonspecific skin eruption: Secondary | ICD-10-CM

## 2024-09-02 DIAGNOSIS — I10 Essential (primary) hypertension: Secondary | ICD-10-CM

## 2024-09-02 MED ORDER — CARVEDILOL 3.125 MG PO TABS: 3.1250 mg | ORAL_TABLET | Freq: Two times a day (BID) | ORAL | 11 refills | Status: AC

## 2024-09-02 NOTE — Progress Notes (Unsigned)
 Weight today = 170 lb  Last time = 175   The patient is getting Metformin  500 twice ( half tablet.) Jardiance  10, and Ozempic  0.5. she has been on 0.5 for one week.  Her bp today frst time = 170/90    pulse 91  Second time = 160/90  She is getting hydrochlorothiazide   and lesinopiril  And she has not gotten them today.She is instructed to get the bp medidcation when she comes for bp

## 2024-09-02 NOTE — Progress Notes (Unsigned)
 BP still high Abdominal pain resolved as is abdominal rash. Not a clear etiology Urine culture negative Adding Carvedilol  3.125 mg twice daily with repeat BP and pulse check week after next.   Call for problems with med.  Sugars AM generally below 110 Sugars PM generally below 120, one at 140. Stopping metformin   COVID when returns on Dec 29th for bp check Will also need to get pneumococcal 20, Tdap, Hep B and Hep A completion

## 2024-09-03 LAB — BASIC METABOLIC PANEL WITH GFR
BUN/Creatinine Ratio: 22 (ref 9–23)
BUN: 13 mg/dL (ref 6–24)
CO2: 19 mmol/L — ABNORMAL LOW (ref 20–29)
Calcium: 9.2 mg/dL (ref 8.7–10.2)
Chloride: 104 mmol/L (ref 96–106)
Creatinine, Ser: 0.59 mg/dL (ref 0.57–1.00)
Glucose: 148 mg/dL — ABNORMAL HIGH (ref 70–99)
Potassium: 3.5 mmol/L (ref 3.5–5.2)
Sodium: 140 mmol/L (ref 134–144)
eGFR: 104 mL/min/1.73 (ref >59)

## 2024-09-14 ENCOUNTER — Ambulatory Visit: Payer: Self-pay

## 2024-09-14 VITALS — BP 158/80 | HR 76

## 2024-09-14 DIAGNOSIS — Z0130 Encounter for examination of blood pressure without abnormal findings: Secondary | ICD-10-CM

## 2024-09-14 DIAGNOSIS — Z23 Encounter for immunization: Secondary | ICD-10-CM

## 2024-09-14 NOTE — Progress Notes (Unsigned)
 160/80 bp and pulse 76 first check  158/80 bp second check  She is taking carvedilol  3.125 , HctZ 25 , Lisinopril  40, She has  gotten only carvedilol  3.125 this morning and she is instucted to get them when she comes for blood pressure check   Come back in two weeks again for BP Check  DM:  stopped metformin  after 12/9 visit.   Currently taking Semaglutide  0.5 mg weekly and Jardiance  10 mg daily.    AM sugars:  96-122 PM sugars 98-140 Stay the course with Ozempic  and Jardiance .  Called the patient and notified her. She wants to come for bp check on 1/23 when she has labs.

## 2024-09-15 ENCOUNTER — Ambulatory Visit: Payer: Self-pay | Admitting: Internal Medicine

## 2024-09-29 ENCOUNTER — Other Ambulatory Visit: Payer: Self-pay | Admitting: Internal Medicine

## 2024-09-29 DIAGNOSIS — H3321 Serous retinal detachment, right eye: Secondary | ICD-10-CM

## 2024-09-29 NOTE — Progress Notes (Signed)
 Received office notes from [facility] eye through [facility].  Patient was seen 09/22/2024 and found to have detached retina on right and recommended urgent referral to retinal specialist. Dr. Leita Lagos

## 2024-10-09 ENCOUNTER — Other Ambulatory Visit: Payer: Self-pay

## 2024-10-09 VITALS — BP 138/80 | HR 60 | Wt 170.0 lb

## 2024-10-09 DIAGNOSIS — Z0130 Encounter for examination of blood pressure without abnormal findings: Secondary | ICD-10-CM

## 2024-10-09 DIAGNOSIS — E119 Type 2 diabetes mellitus without complications: Secondary | ICD-10-CM

## 2024-10-09 MED ORDER — SEMAGLUTIDE (1 MG/DOSE) 4 MG/3ML ~~LOC~~ SOPN: 1.0000 mg | PEN_INJECTOR | SUBCUTANEOUS | 11 refills | Status: AC

## 2024-10-09 NOTE — Progress Notes (Signed)
 AM Sugars :  97-122 PM sugars:  108-136  To increase to 1 mg of Ozempic  weekly--need info on what day injects in future Stay on 0.5 mg until receives higher dose. Call when starts 1 mg and schedule follow up with weight and sugar logs 2 weeks later

## 2024-10-09 NOTE — Progress Notes (Signed)
 Patient is currently on Ozempic  0.5 mg weekly for about four weeks. Patient is also on Jardiance  10 mg daily. Current weight is 170lb which is unchanged since the last time she was in.   Patient confirmed that she is taking all bp medications including Carvedilol , hydrochlorothiazide , and lisinopril .

## 2024-10-10 LAB — HEMOGLOBIN A1C
Est. average glucose Bld gHb Est-mCnc: 143 mg/dL
Hgb A1c MFr Bld: 6.6 % — ABNORMAL HIGH (ref 4.8–5.6)

## 2024-10-13 ENCOUNTER — Ambulatory Visit: Payer: Self-pay | Admitting: Internal Medicine

## 2024-10-14 NOTE — Progress Notes (Signed)
 Erminio interpreting, the patient is notified and instructed

## 2024-10-23 ENCOUNTER — Encounter: Payer: Self-pay | Admitting: Internal Medicine

## 2024-10-23 ENCOUNTER — Ambulatory Visit: Payer: Self-pay | Admitting: Internal Medicine

## 2024-10-23 ENCOUNTER — Emergency Department (HOSPITAL_COMMUNITY): Admission: EM | Admit: 2024-10-23 | Source: Home / Self Care

## 2024-10-23 ENCOUNTER — Other Ambulatory Visit: Payer: Self-pay

## 2024-10-23 VITALS — BP 160/88 | HR 68 | Resp 16 | Ht 59.0 in | Wt 169.0 lb

## 2024-10-23 DIAGNOSIS — E119 Type 2 diabetes mellitus without complications: Secondary | ICD-10-CM

## 2024-10-23 DIAGNOSIS — H3321 Serous retinal detachment, right eye: Secondary | ICD-10-CM

## 2024-10-23 DIAGNOSIS — Z23 Encounter for immunization: Secondary | ICD-10-CM

## 2024-10-23 DIAGNOSIS — Z1231 Encounter for screening mammogram for malignant neoplasm of breast: Secondary | ICD-10-CM

## 2024-10-23 DIAGNOSIS — I10 Essential (primary) hypertension: Secondary | ICD-10-CM

## 2024-10-23 MED ORDER — SIMVASTATIN 20 MG PO TABS: 20.0000 mg | ORAL_TABLET | Freq: Every day | ORAL | 3 refills | Status: AC

## 2024-10-23 NOTE — Progress Notes (Signed)
 "   Subjective:    Patient ID: Allison Daniels, female   DOB: [birth year], 60 y.o.   MRN: [id number]   HPI  Erminio Bloomer interprets   DM/obesity:  Only using Ozempic  1 mg weekly and Jardiance  10 mg daily.  A1C much improved to 6.6%.  Down 1 lb from visit in December and 13 lbs from September.  Recently, not physically active with weather.  2.  Elevated LDL:  did not get her restarted on simvastatin  back in September.  LDL was 99.  The rest of panel was acceptable.  Simvastatin  appears in her med list below, but she has not taken for over a year.    3.  Hypertension:  Was stressed trying to get here today--all of her options for transportation fell through. Now taking Carvedilol  3.125 mg twice daily, Lisinopril  40 mg daily and hydrochlorothiazide  25 mg daily.    4.  Recent history of LLQ pain:  UA and urine culture ultimately were negative for infection.  She states her symptoms resolved after about 15 days.  She did also develop upper left back pain afterward--that resolved about 2 weeks ago as well.    5.  HM:  needs Tdap, Pneumococcal 20, and Shingrix  Current Meds  Medication Sig   Blood Glucose Monitoring Suppl DEVI May substitute to any manufacturer covered by patient's insurance.  Check blood glucose twice daily before meals and document   carvedilol  (COREG ) 3.125 MG tablet Take 1 tablet (3.125 mg total) by mouth 2 (two) times daily with a meal.   empagliflozin  (JARDIANCE ) 10 MG TABS tablet Take 1 tablet (10 mg total) by mouth daily before breakfast.   Glucose Blood (BLOOD GLUCOSE TEST STRIPS) STRP May substitute to any manufacturer covered by patient's insurance. Check blood glucose twice daily before meals and document   hydrochlorothiazide  (HYDRODIURIL ) 25 MG tablet Take 1 tablet (25 mg total) by mouth daily.   Lancet Device MISC May substitute to any manufacturer covered by patient's insurance.Check blood glucose twice daily before meals and document   Lancets Misc. MISC  May substitute to any manufacturer covered by patient's insurance.  Check blood glucose twice daily before meals and document   lisinopril  (ZESTRIL ) 40 MG tablet Take 1 tablet (40 mg total) by mouth daily.   Semaglutide , 1 MG/DOSE, 4 MG/3ML SOPN Inject 1 mg as directed once a week.   simvastatin  (ZOCOR ) 20 MG tablet Take 1 tablet (20 mg total) by mouth at bedtime.    No Known Allergies   Review of Systems    Objective:   BP (!) 160/88 (BP Location: Left Arm, Patient Position: Sitting, Cuff Size: Normal)   Pulse 68   Resp 16   Ht 4' 11 (1.499 m)   Wt 169 lb (76.7 kg)   BMI 34.13 kg/m   Physical Exam Constitutional:      Appearance: She is obese.  HENT:     Head: Normocephalic and atraumatic.  Eyes:     Extraocular Movements: Extraocular movements intact.     Conjunctiva/sclera: Conjunctivae normal.     Pupils: Pupils are equal, round, and reactive to light.  Cardiovascular:     Rate and Rhythm: Normal rate and regular rhythm.     Pulses: Normal pulses.     Heart sounds: Normal heart sounds. No murmur heard.    No friction rub.  Pulmonary:     Effort: Pulmonary effort is normal.     Breath sounds: Normal breath sounds.  Abdominal:  General: Bowel sounds are normal.     Palpations: Abdomen is soft. There is no hepatomegaly, splenomegaly or mass.     Tenderness: There is no abdominal tenderness.     Hernia: No hernia is present.  Neurological:     Mental Status: She is alert.      Assessment & Plan   DM:  much improved control with Ozempic  and Jardiance .  Now at goal.  Would like for her to work on lifestyle changes to continue with weight loss  2.  Hypertension:  not controlled, but stressed trying to get here today.  Return with bus passes to get here more easily for BP check in 2 weeks.    3.  Elevated LDL:  restart low dose Simvastatin  20 mg daily.  Recheck labs before CPE  4.  LLQ pain:  resolved.  Not clear of etiology.  5.  HM:  Tdap today and will  get pneumococcal with next bp check and Shingrix with CPE.  Mammogram in July "

## 2024-10-23 NOTE — ED Provider Notes (Cosign Needed)
 " [place] [facility] AT [facility] Provider Note   CSN: [id number] Arrival date & time: 10/23/24  2306     Patient presents with: Eye Problem   Allison Daniels is a 59 y.o. female.  Patient with past medical history significant for type II DM, known detached retina of the right eye presents to the emergency department complaining of complete vision loss in the right eye.  Patient reports having some vision out of the right eye until this morning.  They reports seeing a dark spot or shadow towards the right side of their vision up until today.  As of this morning they report no vision out of the right eye.  They have not seen a retinal specialist.  It appears they were initially seen at an eye clinic in early January who noted the detached retina and recommended urgent referral to a retinal specialist.  Spanish interpreter used during encounter.    Eye Problem      Prior to Admission medications  Medication Sig Start Date End Date Taking? Authorizing Provider  Blood Glucose Monitoring Suppl DEVI May substitute to any manufacturer covered by patient's insurance.  Check blood glucose twice daily before meals and document 06/09/24   Adella Norris, MD  carvedilol  (COREG ) 3.125 MG tablet Take 1 tablet (3.125 mg total) by mouth 2 (two) times daily with a meal. 09/02/24   Adella Norris, MD  empagliflozin  (JARDIANCE ) 10 MG TABS tablet Take 1 tablet (10 mg total) by mouth daily before breakfast. 06/09/24   Adella Norris, MD  Glucose Blood (BLOOD GLUCOSE TEST STRIPS) STRP May substitute to any manufacturer covered by patient's insurance. Check blood glucose twice daily before meals and document 06/09/24   Adella Norris, MD  hydrochlorothiazide  (HYDRODIURIL ) 25 MG tablet Take 1 tablet (25 mg total) by mouth daily. 06/09/24   Adella Norris, MD  ibuprofen (ADVIL) 400 MG tablet Take 400 mg by mouth 3 (three) times daily.    [provider]   Lancet Device MISC May substitute to any manufacturer covered by patient's insurance.Check blood glucose twice daily before meals and document 06/09/24   Adella Norris, MD  Lancets Misc. MISC May substitute to any manufacturer covered by patient's insurance.  Check blood glucose twice daily before meals and document 06/09/24   Adella Norris, MD  lisinopril  (ZESTRIL ) 40 MG tablet Take 1 tablet (40 mg total) by mouth daily. 06/09/24   Adella Norris, MD  Semaglutide , 1 MG/DOSE, 4 MG/3ML SOPN Inject 1 mg as directed once a week. 10/09/24   Adella Norris, MD  simvastatin  (ZOCOR ) 20 MG tablet Take 1 tablet (20 mg total) by mouth at bedtime. 10/23/24   Adella Norris, MD    Allergies: Patient has no known allergies.    Review of Systems  Updated Vital Signs BP (!) 150/65   Pulse 64   Temp 98.2 F (36.8 C) (Oral)   Resp 17   SpO2 99%   Physical Exam Eyes:     Funduscopic exam:    Right eye: Red reflex absent.     (all labs ordered are listed, but only abnormal results are displayed) Labs Reviewed - No data to display  EKG: None  Radiology: No results found.   Procedures   Medications Ordered in the ED - No data to display  Clinical Course as of 10/23/24 2356  Fri Oct 23, 2024  2350 Poor prognosis per [facility]. They stated 48 hour follow up in their clinic.  [CC]    Clinical  Course User Index [CC] Jerral Meth, MD                                 Medical Decision Making  Patient presents to the emergency department complaining of monocular vision loss of the right eye.  Patient states that she had some vision in the right eye until this morning.  She had a known retinal detachment but has not seen a retinal specialist.  Differential includes complete retinal detachment, optic neuritis, stroke, others  Based on known history of retinal detachment symptoms seem most consistent with complete retinal detachment.  I have consulted ophthalmology, Dr.  Nonah for recommendations.  He recommends patient call his office Monday morning for urgent appointment. No indication at this time for further emergent workup or admission. Patient to follow up with ophthalmology     Final diagnoses:  Right retinal detachment    ED Discharge Orders     None          Logan Ubaldo KATHEE DEVONNA 10/23/24 2356  "

## 2024-10-23 NOTE — ED Triage Notes (Signed)
 Patient stated she unable to see anything on her right eye started this morning. Family report patient still can see some shadows yesterday.  Patient states she had retinal tear few months ago and suppose to be seen by specialist.

## 2024-10-23 NOTE — Discharge Instructions (Signed)
 Please follow up with ophthalmology for further evaluation. Call Monday morning to schedule an urgent appointment.   Por favor, contacte con el servicio de oftalmologa para una evaluacin adicional. Llame el lunes por la maana para programar una cita urgente.

## 2024-10-29 ENCOUNTER — Ambulatory Visit: Payer: Self-pay

## 2025-01-22 ENCOUNTER — Other Ambulatory Visit: Payer: Self-pay

## 2025-02-26 ENCOUNTER — Other Ambulatory Visit: Payer: Self-pay

## 2025-03-08 ENCOUNTER — Encounter: Payer: Self-pay | Admitting: Internal Medicine
